# Patient Record
Sex: Male | Born: 1945
Health system: Southern US, Community
[De-identification: ages and names within clinical notes are randomized; demographics above are authoritative.]

## PROBLEM LIST (undated history)

## (undated) DIAGNOSIS — C140 Malignant neoplasm of pharynx, unspecified: Secondary | ICD-10-CM

## (undated) DIAGNOSIS — E11319 Type 2 diabetes mellitus with unspecified diabetic retinopathy without macular edema: Secondary | ICD-10-CM

## (undated) DIAGNOSIS — N189 Chronic kidney disease, unspecified: Secondary | ICD-10-CM

## (undated) DIAGNOSIS — I10 Essential (primary) hypertension: Secondary | ICD-10-CM

## (undated) DIAGNOSIS — W19XXXA Unspecified fall, initial encounter: Secondary | ICD-10-CM

## (undated) DIAGNOSIS — M1990 Unspecified osteoarthritis, unspecified site: Secondary | ICD-10-CM

## (undated) DIAGNOSIS — I209 Angina pectoris, unspecified: Secondary | ICD-10-CM

## (undated) DIAGNOSIS — R51 Headache: Secondary | ICD-10-CM

## (undated) DIAGNOSIS — J189 Pneumonia, unspecified organism: Secondary | ICD-10-CM

## (undated) DIAGNOSIS — K219 Gastro-esophageal reflux disease without esophagitis: Secondary | ICD-10-CM

## (undated) DIAGNOSIS — R269 Unspecified abnormalities of gait and mobility: Secondary | ICD-10-CM

## (undated) DIAGNOSIS — I6529 Occlusion and stenosis of unspecified carotid artery: Secondary | ICD-10-CM

## (undated) DIAGNOSIS — I639 Cerebral infarction, unspecified: Secondary | ICD-10-CM

## (undated) DIAGNOSIS — E785 Hyperlipidemia, unspecified: Secondary | ICD-10-CM

## (undated) DIAGNOSIS — E1142 Type 2 diabetes mellitus with diabetic polyneuropathy: Secondary | ICD-10-CM

## (undated) DIAGNOSIS — Z87898 Personal history of other specified conditions: Secondary | ICD-10-CM

## (undated) DIAGNOSIS — R0602 Shortness of breath: Secondary | ICD-10-CM

## (undated) DIAGNOSIS — I219 Acute myocardial infarction, unspecified: Secondary | ICD-10-CM

## (undated) DIAGNOSIS — C801 Malignant (primary) neoplasm, unspecified: Secondary | ICD-10-CM

## (undated) DIAGNOSIS — R55 Syncope and collapse: Secondary | ICD-10-CM

## (undated) DIAGNOSIS — E119 Type 2 diabetes mellitus without complications: Secondary | ICD-10-CM

## (undated) DIAGNOSIS — I679 Cerebrovascular disease, unspecified: Secondary | ICD-10-CM

## (undated) DIAGNOSIS — I499 Cardiac arrhythmia, unspecified: Secondary | ICD-10-CM

## (undated) DIAGNOSIS — H9190 Unspecified hearing loss, unspecified ear: Secondary | ICD-10-CM

## (undated) DIAGNOSIS — R079 Chest pain, unspecified: Secondary | ICD-10-CM

## (undated) DIAGNOSIS — E039 Hypothyroidism, unspecified: Secondary | ICD-10-CM

## (undated) HISTORY — DX: Unspecified hearing loss, unspecified ear: H91.90

## (undated) HISTORY — DX: Personal history of other specified conditions: Z87.898

## (undated) HISTORY — PX: NASAL SINUS SURGERY: SHX719

## (undated) HISTORY — DX: Cerebral infarction, unspecified: I63.9

## (undated) HISTORY — DX: Type 2 diabetes mellitus without complications: E11.9

## (undated) HISTORY — DX: Malignant neoplasm of pharynx, unspecified: C14.0

## (undated) HISTORY — DX: Occlusion and stenosis of unspecified carotid artery: I65.29

## (undated) HISTORY — DX: Cerebrovascular disease, unspecified: I67.9

## (undated) HISTORY — DX: Chest pain, unspecified: R07.9

## (undated) HISTORY — PX: OTHER SURGICAL HISTORY: SHX169

## (undated) HISTORY — DX: Type 2 diabetes mellitus with unspecified diabetic retinopathy without macular edema: E11.319

## (undated) HISTORY — DX: Shortness of breath: R06.02

## (undated) HISTORY — DX: Unspecified abnormalities of gait and mobility: R26.9

## (undated) HISTORY — DX: Syncope and collapse: R55

## (undated) HISTORY — DX: Unspecified fall, initial encounter: W19.XXXA

## (undated) HISTORY — DX: Type 2 diabetes mellitus with diabetic polyneuropathy: E11.42

---

## 2003-01-17 HISTORY — PX: PORT-A-CATH REMOVAL: SHX5289

## 2003-03-04 ENCOUNTER — Encounter: Admission: RE | Admit: 2003-03-04 | Discharge: 2003-03-04 | Payer: Self-pay | Admitting: Endocrinology

## 2003-03-31 ENCOUNTER — Ambulatory Visit: Admission: RE | Admit: 2003-03-31 | Discharge: 2003-06-29 | Payer: Self-pay | Admitting: *Deleted

## 2003-04-03 ENCOUNTER — Encounter: Admission: RE | Admit: 2003-04-03 | Discharge: 2003-04-03 | Payer: Self-pay | Admitting: Dentistry

## 2003-04-09 ENCOUNTER — Ambulatory Visit (HOSPITAL_COMMUNITY): Admission: RE | Admit: 2003-04-09 | Discharge: 2003-04-09 | Payer: Self-pay | Admitting: Dentistry

## 2003-04-14 ENCOUNTER — Ambulatory Visit (HOSPITAL_COMMUNITY): Admission: RE | Admit: 2003-04-14 | Discharge: 2003-04-14 | Payer: Self-pay | Admitting: Oncology

## 2003-04-22 ENCOUNTER — Ambulatory Visit (HOSPITAL_COMMUNITY): Admission: RE | Admit: 2003-04-22 | Discharge: 2003-04-22 | Payer: Self-pay | Admitting: *Deleted

## 2003-06-22 ENCOUNTER — Ambulatory Visit (HOSPITAL_COMMUNITY): Admission: RE | Admit: 2003-06-22 | Discharge: 2003-06-22 | Payer: Self-pay | Admitting: *Deleted

## 2003-07-15 ENCOUNTER — Ambulatory Visit: Admission: RE | Admit: 2003-07-15 | Discharge: 2003-07-16 | Payer: Self-pay | Admitting: *Deleted

## 2003-07-21 ENCOUNTER — Ambulatory Visit (HOSPITAL_COMMUNITY): Admission: RE | Admit: 2003-07-21 | Discharge: 2003-07-21 | Payer: Self-pay | Admitting: Oncology

## 2003-08-10 ENCOUNTER — Ambulatory Visit (HOSPITAL_COMMUNITY): Admission: RE | Admit: 2003-08-10 | Discharge: 2003-08-10 | Payer: Self-pay | Admitting: *Deleted

## 2003-08-12 ENCOUNTER — Ambulatory Visit: Admission: RE | Admit: 2003-08-12 | Discharge: 2003-08-12 | Payer: Self-pay | Admitting: *Deleted

## 2003-08-14 ENCOUNTER — Ambulatory Visit (HOSPITAL_COMMUNITY): Admission: RE | Admit: 2003-08-14 | Discharge: 2003-08-14 | Payer: Self-pay | Admitting: *Deleted

## 2003-08-19 ENCOUNTER — Ambulatory Visit: Admission: RE | Admit: 2003-08-19 | Discharge: 2003-08-19 | Payer: Self-pay | Admitting: *Deleted

## 2003-09-22 ENCOUNTER — Ambulatory Visit: Payer: Self-pay | Admitting: Dentistry

## 2003-09-25 ENCOUNTER — Encounter: Admission: RE | Admit: 2003-09-25 | Discharge: 2003-09-25 | Payer: Self-pay | Admitting: Otolaryngology

## 2003-10-01 ENCOUNTER — Ambulatory Visit: Payer: Self-pay | Admitting: Dentistry

## 2003-10-28 ENCOUNTER — Ambulatory Visit: Payer: Self-pay | Admitting: Dentistry

## 2003-10-29 ENCOUNTER — Ambulatory Visit (HOSPITAL_COMMUNITY): Admission: RE | Admit: 2003-10-29 | Discharge: 2003-10-29 | Payer: Self-pay | Admitting: Oncology

## 2003-11-10 ENCOUNTER — Ambulatory Visit (HOSPITAL_COMMUNITY): Admission: RE | Admit: 2003-11-10 | Discharge: 2003-11-10 | Payer: Self-pay | Admitting: Oncology

## 2003-11-27 ENCOUNTER — Ambulatory Visit: Payer: Self-pay | Admitting: Oncology

## 2003-12-02 ENCOUNTER — Ambulatory Visit: Payer: Self-pay | Admitting: Dentistry

## 2003-12-18 ENCOUNTER — Ambulatory Visit: Admission: RE | Admit: 2003-12-18 | Discharge: 2003-12-18 | Payer: Self-pay | Admitting: *Deleted

## 2003-12-28 ENCOUNTER — Ambulatory Visit (HOSPITAL_COMMUNITY): Admission: RE | Admit: 2003-12-28 | Discharge: 2003-12-28 | Payer: Self-pay | Admitting: Oncology

## 2003-12-31 ENCOUNTER — Ambulatory Visit (HOSPITAL_COMMUNITY): Admission: RE | Admit: 2003-12-31 | Discharge: 2003-12-31 | Payer: Self-pay | Admitting: Oncology

## 2004-01-19 ENCOUNTER — Ambulatory Visit (HOSPITAL_COMMUNITY): Admission: RE | Admit: 2004-01-19 | Discharge: 2004-01-19 | Payer: Self-pay | Admitting: Oncology

## 2004-03-01 ENCOUNTER — Ambulatory Visit: Payer: Self-pay | Admitting: Dentistry

## 2004-03-31 ENCOUNTER — Ambulatory Visit: Payer: Self-pay | Admitting: Oncology

## 2004-04-18 ENCOUNTER — Ambulatory Visit (HOSPITAL_COMMUNITY): Admission: RE | Admit: 2004-04-18 | Discharge: 2004-04-18 | Payer: Self-pay | Admitting: Oncology

## 2004-04-19 ENCOUNTER — Ambulatory Visit: Admission: RE | Admit: 2004-04-19 | Discharge: 2004-04-19 | Payer: Self-pay | Admitting: *Deleted

## 2004-04-25 ENCOUNTER — Ambulatory Visit: Admission: RE | Admit: 2004-04-25 | Discharge: 2004-04-27 | Payer: Self-pay | Admitting: *Deleted

## 2004-08-08 ENCOUNTER — Ambulatory Visit (HOSPITAL_COMMUNITY): Admission: RE | Admit: 2004-08-08 | Discharge: 2004-08-08 | Payer: Self-pay | Admitting: Oncology

## 2004-08-18 ENCOUNTER — Ambulatory Visit: Payer: Self-pay | Admitting: Oncology

## 2004-09-13 ENCOUNTER — Ambulatory Visit: Payer: Self-pay | Admitting: Dentistry

## 2004-12-16 ENCOUNTER — Ambulatory Visit: Payer: Self-pay | Admitting: Oncology

## 2004-12-21 ENCOUNTER — Ambulatory Visit (HOSPITAL_COMMUNITY): Admission: RE | Admit: 2004-12-21 | Discharge: 2004-12-21 | Payer: Self-pay | Admitting: Oncology

## 2005-02-28 ENCOUNTER — Ambulatory Visit: Payer: Self-pay | Admitting: Oncology

## 2005-10-17 ENCOUNTER — Ambulatory Visit: Payer: Self-pay | Admitting: Oncology

## 2005-10-23 ENCOUNTER — Ambulatory Visit (HOSPITAL_COMMUNITY): Admission: RE | Admit: 2005-10-23 | Discharge: 2005-10-23 | Payer: Self-pay | Admitting: Oncology

## 2005-10-27 ENCOUNTER — Ambulatory Visit (HOSPITAL_COMMUNITY): Admission: RE | Admit: 2005-10-27 | Discharge: 2005-10-27 | Payer: Self-pay | Admitting: Oncology

## 2006-04-18 ENCOUNTER — Ambulatory Visit: Payer: Self-pay | Admitting: Oncology

## 2006-04-23 LAB — CBC WITH DIFFERENTIAL/PLATELET
EOS%: 1.3 % (ref 0.0–7.0)
Eosinophils Absolute: 0.1 10*3/uL (ref 0.0–0.5)
HCT: 32.8 % — ABNORMAL LOW (ref 38.7–49.9)
HGB: 11.7 g/dL — ABNORMAL LOW (ref 13.0–17.1)
Platelets: 155 10*3/uL (ref 145–400)
RBC: 3.79 10*6/uL — ABNORMAL LOW (ref 4.20–5.71)
WBC: 4.5 10*3/uL (ref 4.0–10.0)

## 2006-04-23 LAB — LACTATE DEHYDROGENASE: LDH: 120 U/L (ref 94–250)

## 2006-04-23 LAB — COMPREHENSIVE METABOLIC PANEL
ALT: 9 U/L (ref 0–53)
AST: 10 U/L (ref 0–37)
BUN: 30 mg/dL — ABNORMAL HIGH (ref 6–23)
Creatinine, Ser: 1.7 mg/dL — ABNORMAL HIGH (ref 0.40–1.50)
Sodium: 137 mEq/L (ref 135–145)
Total Bilirubin: 0.4 mg/dL (ref 0.3–1.2)
Total Protein: 6.7 g/dL (ref 6.0–8.3)

## 2006-04-25 ENCOUNTER — Ambulatory Visit (HOSPITAL_COMMUNITY): Admission: RE | Admit: 2006-04-25 | Discharge: 2006-04-25 | Payer: Self-pay | Admitting: Oncology

## 2006-09-10 ENCOUNTER — Encounter: Admission: RE | Admit: 2006-09-10 | Discharge: 2006-09-10 | Payer: Self-pay | Admitting: Endocrinology

## 2006-10-24 ENCOUNTER — Ambulatory Visit: Payer: Self-pay | Admitting: Oncology

## 2006-10-26 ENCOUNTER — Ambulatory Visit (HOSPITAL_COMMUNITY): Admission: RE | Admit: 2006-10-26 | Discharge: 2006-10-26 | Payer: Self-pay | Admitting: Neurosurgery

## 2006-12-28 ENCOUNTER — Ambulatory Visit (HOSPITAL_COMMUNITY): Admission: RE | Admit: 2006-12-28 | Discharge: 2006-12-28 | Payer: Self-pay | Admitting: *Deleted

## 2006-12-28 ENCOUNTER — Encounter (INDEPENDENT_AMBULATORY_CARE_PROVIDER_SITE_OTHER): Payer: Self-pay | Admitting: *Deleted

## 2007-01-25 ENCOUNTER — Ambulatory Visit (HOSPITAL_COMMUNITY): Admission: RE | Admit: 2007-01-25 | Discharge: 2007-01-25 | Payer: Self-pay | Admitting: *Deleted

## 2009-09-08 ENCOUNTER — Other Ambulatory Visit: Admission: RE | Admit: 2009-09-08 | Discharge: 2009-09-08 | Payer: Self-pay | Admitting: Oncology

## 2010-02-06 ENCOUNTER — Encounter: Payer: Self-pay | Admitting: Oncology

## 2010-05-31 NOTE — Op Note (Signed)
NAME:  Todd Hall, Todd Hall NO.:  [id number]   MEDICAL RECORD NO.:  [id number]          PATIENT TYPE:  AMB   LOCATION:  ENDO                         FACILITY:  [facility]   PHYSICIAN:  Georgiana Spinner, M.D.    DATE OF BIRTH:  December 08, 1945   DATE OF PROCEDURE:  DATE OF DISCHARGE:                               OPERATIVE REPORT   PROCEDURE:  Upper endoscopy with biopsy.   INDICATIONS FOR PROCEDURE:  Vomiting.   ANESTHESIA:  Fentanyl 50 mcg, Versed 5 mg.   PROCEDURE:  With the patient mildly sedated in the left lateral  decubitus position, the Pentax videoscopic endoscope was inserted in the  mouth and passed under direct vision through the esophagus which  appeared normal.  There was no evidence of esophagitis or Barrett's  esophagus.  We entered into the stomach.  The fundus and body appeared  normal.  The antrum in the prepyloric area appeared somewhat edematous,  and I elected to photograph and biopsy this.  The duodenal bulb and  second portion of the duodenum appeared normal.  From this point the  endoscope was slowly withdrawn, taking circumferential views of the  duodenal mucosa until the endoscope had been pulled back into stomach,  placed in retroflexion to view the stomach from below.  The endoscope  was straightened and withdrawn, taking circumferential views of the  remaining gastric and esophageal mucosa.  The patient's vital signs and  pulse oximeter remained stable.  The patient tolerated the procedure  well without apparent complications.   FINDINGS:  Mild, possibly edematous changes of the prepyloric area,  biopsied.  Otherwise an unremarkable examination.   PLAN:  We will schedule the patient for a gastric emptying study and  have the patient follow up with me as an outpatient.           ______________________________  Georgiana Spinner, M.D.     GMO/MEDQ  D:  12/28/2006  T:  12/28/2006  Job:  [id number]

## 2010-06-03 NOTE — Op Note (Signed)
NAME:  Todd Hall, Todd Hall                          ACCOUNT NO.:  [id number]   MEDICAL RECORD NO.:  [id number]                   PATIENT TYPE:  AMB   LOCATION:  DAY                                  FACILITY:  [facility]   PHYSICIAN:  Charlynne Pander, D.D.S.          DATE OF BIRTH:  03/25/1945   DATE OF PROCEDURE:  04/09/2003  DATE OF DISCHARGE:                                 OPERATIVE REPORT   PREOPERATIVE DIAGNOSES:  1. Squamous cell carcinoma of the left base of tongue.  2. Preradiation therapy dental protocol.  3. Chronic periodontitis.  4. Upper right and upper left quadrant buccal exostoses.  5. Malocclusion of the maxillary tuberosities.   POSTOPERATIVE DIAGNOSES:  1. Squamous cell carcinoma of the left base of tongue.  2. Preradiation therapy dental protocol.  3. Chronic periodontitis.  4. Upper right and upper left quadrant buccal exostoses.  5. Malocclusion of the maxillary tuberosities.   OPERATION/PROCEDURE:  1. Dental examination.  2. Extraction of remaining teeth (tooth #6, 7, 8, 9, 10, 11, 12, 20, 21, 22,     23, 24, 25, 26, 27, 28, and 29).  3. Four quadrants of alveoloplasty.  4. Upper right and upper left quadrant buccal exostosis reduction.  5. Upper right and upper left soft tissue tuberosity reduction.   SURGEON:  Charlynne Pander, D.D.S.   ASSISTANT:  Elliot Dally (Sales executive).   ANESTHESIA:  1. General anesthesia with a nasal endotracheal tube.  2. Local anesthesia with a total utilization of five carpules, each     containing 36 mg of Xylocaine with 0.018 mg of epinephrine as well as two     carpules each containing 9 mg of Marcaine with 0.009 mg of epinephrine.   MEDICATIONS:  Ampicillin 2 g IV prior to invasive dental procedures.   SPECIMENS:  There were 17 teeth which were discarded.   DRAINS/CULTURES:  None.   COMPLICATIONS:  None.   FLUIDS:  1700 mL of lactated Ringer's solution.   ESTIMATED BLOOD LOSS:  100 mL.   INDICATIONS:  The  patient had a recent diagnosis of squamous cell carcinoma  of the left base of tongue.  The patient with anticipated radiation therapy.  The patient was examined and had a treatment plan for a preradiation therapy  dental protocol.  Treatment plan for removal of all remaining teeth with  alveoloplasty to achieve primary closure along with the upper right and  upper left exostosis reductions on the buccal aspect as well as fibrous  tuberosity reductions as indicated.  His treatment plan was formulated to  decrease the risk and complications associated with anticipated radiation  therapy to effect the patient's systemic health while undergoing radiation  therapy and chemotherapy.  This also will assist in the future  prosthodontics rehabilitation of the patient.   OPERATIVE FINDINGS:  The patient was examined in the operating room #6.  The  teeth were identified for extraction.  The upper right and upper left buccal  exostoses were identified along with the maxillary tuberosities which were  hanging down into the ideal occlusal plane and needed to be reduced.  The  patient was noted to be affected by chronic periodontitis, significant tooth  mobility, chronic apical periodontitis, and the presence of the buccal  exostoses as above.   DESCRIPTION OF PROCEDURE:  The patient was brought to the main operating  room #6.  The patient was placed in the supine position on the operating  room table.  General anesthesia was induced per the anesthesia team with a  nasal endotracheal tube.  The patient was then prepped and draped in the  usual manner for an oral surgery procedure.  The throat pack was placed at  this time.  The oral cavity was thoroughly examined with the findings as  noted above.  The patient was then ready for the oral surgical procedure as  follows:   Local anesthesia was administered sequentially over the two-hour long  procedure with a total utilization of five carpules each  containing 36 mg of  Xylocaine with 0.018 mg of epinephrine as well as two carpules each  containing 9 mg Marcaine with 0.009 mg of epinephrine.   The maxillary quadrants were first approached.  Anesthesia was delivered as  previously described.  The maxillary right quadrant was then approached.  A  15-blade incision was made from the distal of the tuberosity through the  distal of #14.  A surgical flap was then reflected.  The remaining teeth  were then subluxated and removed with a 150 forceps without complications.  This included the extraction of tooth #6, 7, 8, 9, 10, 11, and 12.  Again  these were achieved without complications.  A 15-blade was then made from  the distal of the maxillary left tuberosity and extended through the  previous incision.  The surgical flap was then further reflected.  The upper  right quadrant and upper left quadrant buccal exostoses were then visualized  and removed with a rongeurs and bone file appropriately.  The fibrous  tuberosity reduction was then achieved utilizing a series of 15-blade  incisions to remove the excess tissue.  The surgical site was then irrigated  with copious amounts of sterile saline.  The tissues were approximated and  again trimmed appropriately.  The surgical site was then closed utilizing 3-  0 chromic gut suture in a continuous interrupted suture technique from the  maxillary right tuberosity through the mesial of #8.  The maxillary left  quadrant was then closed utilizing 3-0 chromic gut suture and a continuous  interrupted suture technique from the distal of the tuberosity through the  mesial of #9.  The entire mouth was then again irrigated with copious  amounts of sterile saline at this time.   The mandibular quadrants were then approached.  Anesthesia was delivered as  previously described.  A 15-blade incision was made from the distal of #19 through the distal of #30.  Surgical flap was then reflected.  Tooth #20,  21,  22, 23, 24, 25, 26, 27, 28, and 29 were then removed with 151 forceps  without complications.  Significant alveoloplasty was then performed  utilizing a rongeurs and bone file.  The tissues were approximated and  trimmed approximately.  The patient had significant gingival defects  associated with the previous periodontal problems.  The surgical site was  then irrigated with copious amounts of sterile saline.  The tissues were  approximated and trimmed appropriately.  The surgical site was then closed  from the distal of #19 through the mesial of #24 utilizing 3-0 chromic gut  suture in a continuous interrupted suture technique x1.  The mandibular  right quadrant was then approached and closed utilizing 3-0 chromic gut  suture from the distal of #30 through the mesial of #25 with a continuous  interrupted suture technique x1.  At this point in time the entire mouth was  irrigated with copious amounts of sterile saline.  The patient was examined  for complications.  Seeing none, the oral surgical procedure was deemed to  be complete.  The throat pack was removed at this time.  The series of 4 x 4  gauzes were placed in the mouth at the request of the anesthesia team along  with an oral airway.  The patient was then handed over to the anesthesia  team for final disposition.  After an appropriate amount of time, the  patient was extubated and taken to the post anesthesia care unit with stable  vital signs and good oxygenation level.   The patient will be given one week's worth of amoxicillin 500 mg taking one  capsule every eight hours for seven days.  The patient also will be given  appropriate pain medication.  The patient is to return to the dental clinic  for evaluation for suture removal in approximately one week.  The patient is  to call if he has any complications.                                               Charlynne Pander, D.D.S.    RFK/MEDQ  D:  04/09/2003  T:  04/09/2003   Job:  [id number]   cc:   Elmer Sow. Dorna Bloom, M.D.  [address] - [facility]  [place]  [place] [address]  Fax: [phone]   Pierce Crane, M.D.  [address] - [facility]  [place]  [place] [address]  Fax: [phone]

## 2010-10-27 LAB — BASIC METABOLIC PANEL
Calcium: 9.6
Chloride: 108
GFR calc Af Amer: 46 — ABNORMAL LOW
GFR calc non Af Amer: 38 — ABNORMAL LOW
Sodium: 137

## 2010-10-27 LAB — CBC
HCT: 33.8 — ABNORMAL LOW
Hemoglobin: 11.5 — ABNORMAL LOW
MCV: 90.9
Platelets: 150
RBC: 3.72 — ABNORMAL LOW

## 2010-10-27 LAB — POTASSIUM: Potassium: 6.2 — ABNORMAL HIGH

## 2010-12-21 ENCOUNTER — Encounter (HOSPITAL_COMMUNITY): Payer: Self-pay

## 2010-12-21 ENCOUNTER — Encounter (HOSPITAL_COMMUNITY): Payer: Self-pay | Admitting: Pharmacy Technician

## 2010-12-21 ENCOUNTER — Encounter (HOSPITAL_COMMUNITY)
Admission: RE | Admit: 2010-12-21 | Discharge: 2010-12-21 | Disposition: A | Discharge status: Home / Self Care | Payer: Medicare Other | Source: Ambulatory Visit | Attending: Neurosurgery | Admitting: Neurosurgery

## 2010-12-21 ENCOUNTER — Other Ambulatory Visit: Payer: Self-pay

## 2010-12-21 HISTORY — DX: Acute myocardial infarction, unspecified: I21.9

## 2010-12-21 HISTORY — DX: Angina pectoris, unspecified: I20.9

## 2010-12-21 HISTORY — DX: Cardiac arrhythmia, unspecified: I49.9

## 2010-12-21 HISTORY — DX: Headache: R51

## 2010-12-21 HISTORY — DX: Pneumonia, unspecified organism: J18.9

## 2010-12-21 HISTORY — DX: Essential (primary) hypertension: I10

## 2010-12-21 HISTORY — DX: Gastro-esophageal reflux disease without esophagitis: K21.9

## 2010-12-21 HISTORY — DX: Hypothyroidism, unspecified: E03.9

## 2010-12-21 HISTORY — DX: Chronic kidney disease, unspecified: N18.9

## 2010-12-21 HISTORY — DX: Malignant (primary) neoplasm, unspecified: C80.1

## 2010-12-21 HISTORY — DX: Unspecified osteoarthritis, unspecified site: M19.90

## 2010-12-21 HISTORY — DX: Hyperlipidemia, unspecified: E78.5

## 2010-12-21 LAB — CBC
MCH: 30.4 pg (ref 26.0–34.0)
MCHC: 33.1 g/dL (ref 30.0–36.0)
MCV: 91.7 fL (ref 78.0–100.0)
Platelets: 156 10*3/uL (ref 150–400)
RBC: 3.62 MIL/uL — ABNORMAL LOW (ref 4.22–5.81)
RDW: 13.5 % (ref 11.5–15.5)

## 2010-12-21 LAB — SURGICAL PCR SCREEN
MRSA, PCR: NEGATIVE
Staphylococcus aureus: NEGATIVE

## 2010-12-21 LAB — BASIC METABOLIC PANEL
Calcium: 9.3 mg/dL (ref 8.4–10.5)
Creatinine, Ser: 1.57 mg/dL — ABNORMAL HIGH (ref 0.50–1.35)
GFR calc Af Amer: 52 mL/min — ABNORMAL LOW (ref >90)
GFR calc non Af Amer: 45 mL/min — ABNORMAL LOW (ref >90)
Sodium: 138 mEq/L (ref 135–145)

## 2010-12-21 NOTE — Progress Notes (Signed)
CXR requested from Dr. Marylen Ponto office via vm message.

## 2010-12-21 NOTE — Pre-Procedure Instructions (Signed)
20 GURVEER COLUCCI  12/21/2010   Your procedure is scheduled on:  12-27-2010 Tuesday  Report to [facility] Stay [facility] at 9:30 AM.  Call this number if you have problems the morning of surgery: [phone]   Remember:   Do not eat food:After Midnight.  May have clear liquids: up to 4 Hours before arrival.  Clear liquids include soda, tea, black coffee, apple or grape juice, broth.  Take these medicines the morning of surgery with A SIP OF WATER: coreg, vicodin if needed, omeprazole   Do not wear jewelry, make-up or nail polish.  Do not wear lotions, powders, or perfumes. You may wear deodorant.  Do not shave 48 hours prior to surgery.  Do not bring valuables to the hospital.  Contacts, dentures or bridgework may not be worn into surgery.  Leave suitcase in the car. After surgery it may be brought to your room.  For patients admitted to the hospital, checkout time is 11:00 AM the day of discharge.   Patients discharged the day of surgery will not be allowed to drive home.  Name and phone number of your driver: siris hoos - wife [phone]  Special Instructions: CHG Shower Use Special Wash: 1/2 bottle night before surgery and 1/2 bottle morning of surgery.   Please read over the following fact sheets that you were given: Pain Booklet, Coughing and Deep Breathing, MRSA Information and Surgical Site Infection Prevention

## 2010-12-22 NOTE — Progress Notes (Signed)
LVM for Misty Stanley in Medical Records at Dr. Marylen Ponto office requesting CXR.

## 2010-12-24 NOTE — H&P (Signed)
Todd Hall  #[id number]  DOB:  Mar 21, 1945    Todd Hall returns to review his MRI of his cervical spine which re-demonstrates a broad-based disc protrusion at C6-7 asymmetric into the left lateral recess and left neural foramen which appears to be compressing the left C7 nerve root.  He additionally has some degenerative changes at other levels in his cervical spine, including at C3-4 he has a broad-based disc bulge with prominent spurring to the right and left midline narrowing both lateral recesses and both neural foramina with severe facet arthritis at this level.  Other abnormalities include at C4-5 he has a tiny disc protrusion just to the left of midline without significant impingement.  At C5-6 he has a small central disc protrusion without significant impingement.  Additionally, he has C7-T1 through T5 no evidence of any significant disc protrusions.    I re-examined Todd Hall today and on examination today he has significant triceps weakness on the left, as well as some persistent hand intrinsic and finger extensor weakness.    Based on Todd Hall' MRI findings as well as his EMG and nerve conduction velocities and his pain complaints which are principally of left upper extremity pain and weakness rather than right upper extremity symptoms, I believe that his biggest problem remains the C6-7 disc herniation and left C7 radiculopathy.  I have recommended he go a head with anterior cervical decompression and fusion at the C6-7 level which is the same surgery that I recommended in 2008 which was cancelled because of hyperkalemia prior to going ahead with surgery.  He says he is essentially no better, continues to have significant pain and weakness and, at this point, I recommend proceeding with surgery.  This will be planned for early December.  Risks and benefits were discussed with the patient in great detail and we also went over patient education materials and gave him literature and a booklet on  cervical spine surgery and showed him models and answered his questions.  We plan on fitting him for a cervical collar and plan on proceeding with surgery in the near future.          Todd Hall, M.D./sv  cc: Dr. Adela Lank   NEUROSURGICAL CONSULTATION AND H & P   Todd Hall  DOB:  April 15, 1945 #[id number]    November 02, 2010   HISTORY:     Todd Hall is a 65 year old retired man who presents at the request of Todd Hall with bilateral hand and feet numbness. He says he is dropping things with his hands. He says his left side is weaker than his right. He says this has increased recently, but has been going on for the last year.  He had an EMG and nerve conduction velocities performed by Dr. Wynn Hall and this demonstrates evidence of right ulnar neuropathy at the elbow, with slowing of the ulnar and median sensory conductions, with some asymmetry of the right side with absent right median sensory conduction. He also has lower extremity numbness. This was felt to be potentially be a part of a more widespread sensory polyneuropathy associated with his diabetes and prior history of chemotherapy.  Lower extremity studies would need to be performed to confirm that.  He cannot rule out carpal tunnel syndrome on the right. Additionally, he has chronic left C7 and C8 radiculopathies.   I had previously seen Todd Hall for neck problems and actually had scheduled him for surgery, but is potassium was elevated and this  was therefore canceled. This was in October of 2008 and was planned as a C6-7 anterior cervical decompression and fusion.    REVIEW OF SYSTEMS:   A detailed Review of Systems sheet was reviewed with the patient.  Pertinent positives include hearing loss, nasal congestion/drainage, high blood pressure, high cholesterol, arm weakness, leg weakness, neck pain, diabetes, and thyroid disease.   All other systems are negative; this includes Constitutional symptoms, Eyes, Respiratory,  Gastrointestinal, Genitourinary, Integumentary & Breast, Neurologic, Psychiatric, Hematologic/Lymphatic, Allergic/Immunologic.    PAST MEDICAL HISTORY:      Current Medical Conditions:    Throat cancer with previous radiation.  He has high blood pressure, heart attack and diabetes.      Medications and Allergies:  He has no known drug allergies. Medications - Humalog 50/20, Amitriptyline 50 mg. q.h.s., Hectorol 0.5 mg. qd, Synthroid 75 mcg. qd, Coreg 20 mg. q.h.s., Omeprazole 40 g. qd, Crestor, Sodium Bicar 650 b.i.d. and Metanx b.i.d.      Height and Weight:     He is 5'7" tall, 155 lbs.    SOCIAL HISTORY:    He is a nonsmoker, nondrinker, and no history of substance abuse.    PHYSICAL EXAMINATION:      General Appearance:   On examination today, Todd Hall is a pleasant and cooperative man in no acute distress.      Blood Pressure, Pulse:     His blood pressure is 140/78.  Heart rate is 70 and regular.  Respiratory rate is 16.      HEENT - normocephalic, atraumatic.  The pupils are equal, round and reactive to light.  The extraocular muscles are intact.  Sclerae - white.  Conjunctiva - pink.  Oropharynx benign.  Uvula midline.     Neck - there are no masses, meningismus, deformities, tracheal deviation, jugular vein distention or carotid bruits.  He has a decreased cervical range of motion.  Positive Spurlings' maneuver to the left.  Lhermitte's sign is not present with axial compression.  He has never had prior surgery, but did have radiation to his neck.      Respiratory - there is normal respiratory effort with good intercostal function.  Lungs are clear to auscultation.  There are no rales, rhonchi or wheezes.      Cardiovascular - the heart has regular rate and rhythm to auscultation.  No murmurs are appreciated.  There is no extremity edema, cyanosis or clubbing.  There are palpable pedal pulses.      Abdomen - soft, nontender, no hepatosplenomegaly appreciated or masses.  There are  active bowel sounds.  No guarding or rebound.      Musculoskeletal Examination - he has a positive Tinel's sign at the right elbow greater than left elbow, positive Tinel's sign at the left wrist, positive Phalen's signs left greater than right wrist.      NEUROLOGICAL EXAMINATION: The patient is oriented to time, person and place and has good recall of both recent and remote memory with normal attention span and concentration.  The patient speaks with clear and fluent speech and exhibits normal language function and appropriate fund of knowledge.      Cranial Nerve Examination - pupils are equal, round and reactive to light.  Extraocular movements are full.  Visual fields are full to confrontational testing.  Facial sensation and facial movement are symmetric and intact.  Hearing is intact to finger rub.  Palate is upgoing.  Shoulder shrug is symmetric.  Tongue protrudes in the midline.  Motor Examination - motor strength is 5/5 in the bilateral deltoids, biceps, handgrips, wrist extensors, interosseous, 5/5 right triceps, left triceps at 4/5, left hand intrinsics and finger extensors at 4/5.  In the lower extremities motor strength is 5/5 in hip flexion, extension, quadriceps, hamstrings, plantar flexion, dorsiflexion and extensor hallucis longus.      Sensory Examination - he appears to have decreased vibratory sensation in both of his feet, left greater than right.  He has intact proprioception in both of his feet.  He has stocking distribution of numbness with decreased pin sensation to the middle of his ankles.  He appears to have a decreased pin sensation in the left ulnar distribution in both arms to his forearms.       Deep Tendon Reflexes - trace in the biceps, triceps, and brachioradialis, trace in the knees, trace in the ankles.  The great toes are downgoing to plantar stimulation.    IMPRESSION AND RECOMMENDATIONS: Jayko Voorhees is a 65 year old man who appears to have a cervical  radiculopathy, peripheral neuropathy, ulnar neuropathy, and carpal tunnel syndrome.  I suggested we try wrist splints bilaterally to see if this will help with his discomfort. I do think that his principal problem is related to a cervical radiculopathy in that he appears to have weakness in is left arm and hand in a C7 and C8 distribution. I have recommended we get an MRI of his cervical spine and also cervical spine radiographs.  We will see how he does with the wrist splints and I will make further recommendations after those studies have been done.    [facility]    Todd Hall, M.D.  JDS:aft cc: Dr. Bernadene Person

## 2010-12-27 ENCOUNTER — Encounter (HOSPITAL_COMMUNITY): Payer: Self-pay | Admitting: Anesthesiology

## 2010-12-27 ENCOUNTER — Encounter (HOSPITAL_COMMUNITY): Payer: Self-pay | Admitting: *Deleted

## 2010-12-27 ENCOUNTER — Ambulatory Visit (HOSPITAL_COMMUNITY): Payer: Medicare Other | Admitting: *Deleted

## 2010-12-27 ENCOUNTER — Encounter (HOSPITAL_COMMUNITY)
Admission: RE | Disposition: A | Discharge status: Home / Self Care | Payer: Self-pay | Source: Ambulatory Visit | Attending: Neurosurgery

## 2010-12-27 ENCOUNTER — Ambulatory Visit (HOSPITAL_COMMUNITY): Payer: Medicare Other

## 2010-12-27 ENCOUNTER — Inpatient Hospital Stay (HOSPITAL_COMMUNITY)
Admission: RE | Admit: 2010-12-27 | Discharge: 2010-12-28 | DRG: 473 | Disposition: A | Discharge status: Home / Self Care | Payer: Medicare Other | Source: Ambulatory Visit | Attending: Neurosurgery | Admitting: Neurosurgery

## 2010-12-27 DIAGNOSIS — M5412 Radiculopathy, cervical region: Secondary | ICD-10-CM | POA: Diagnosis present

## 2010-12-27 DIAGNOSIS — H9190 Unspecified hearing loss, unspecified ear: Secondary | ICD-10-CM | POA: Diagnosis present

## 2010-12-27 DIAGNOSIS — Z79899 Other long term (current) drug therapy: Secondary | ICD-10-CM

## 2010-12-27 DIAGNOSIS — Z794 Long term (current) use of insulin: Secondary | ICD-10-CM

## 2010-12-27 DIAGNOSIS — E1142 Type 2 diabetes mellitus with diabetic polyneuropathy: Secondary | ICD-10-CM | POA: Diagnosis present

## 2010-12-27 DIAGNOSIS — I10 Essential (primary) hypertension: Secondary | ICD-10-CM | POA: Diagnosis present

## 2010-12-27 DIAGNOSIS — E079 Disorder of thyroid, unspecified: Secondary | ICD-10-CM | POA: Diagnosis present

## 2010-12-27 DIAGNOSIS — E1149 Type 2 diabetes mellitus with other diabetic neurological complication: Secondary | ICD-10-CM | POA: Diagnosis present

## 2010-12-27 DIAGNOSIS — Z01812 Encounter for preprocedural laboratory examination: Secondary | ICD-10-CM

## 2010-12-27 DIAGNOSIS — M5020 Other cervical disc displacement, unspecified cervical region: Principal | ICD-10-CM | POA: Diagnosis present

## 2010-12-27 DIAGNOSIS — E7800 Pure hypercholesterolemia, unspecified: Secondary | ICD-10-CM | POA: Diagnosis present

## 2010-12-27 HISTORY — PX: ANTERIOR CERVICAL DECOMP/DISCECTOMY FUSION: SHX1161

## 2010-12-27 LAB — GLUCOSE, CAPILLARY
Glucose-Capillary: 209 mg/dL — ABNORMAL HIGH (ref 70–99)
Glucose-Capillary: 220 mg/dL — ABNORMAL HIGH (ref 70–99)

## 2010-12-27 SURGERY — ANTERIOR CERVICAL DECOMPRESSION/DISCECTOMY FUSION 1 LEVEL
Anesthesia: General | Site: Neck | Wound class: Clean

## 2010-12-27 MED ORDER — ACETAMINOPHEN 325 MG PO TABS: 650.0000 mg | ORAL_TABLET | ORAL | Status: DC | PRN

## 2010-12-27 MED ORDER — GLYCOPYRROLATE 0.2 MG/ML IJ SOLN
INTRAMUSCULAR | Status: DC | PRN
  Administered 2010-12-27: .6 mg via INTRAVENOUS

## 2010-12-27 MED ORDER — ROSUVASTATIN CALCIUM 10 MG PO TABS
10.0000 mg | ORAL_TABLET | Freq: Every day | ORAL | Status: DC
  Administered 2010-12-27 – 2010-12-28 (×2): 10 mg via ORAL
  Filled 2010-12-27 (×2): qty 1

## 2010-12-27 MED ORDER — DEXAMETHASONE SODIUM PHOSPHATE 10 MG/ML IJ SOLN
INTRAMUSCULAR | Status: DC | PRN
  Administered 2010-12-27: 10 mg via INTRAVENOUS

## 2010-12-27 MED ORDER — LEVOTHYROXINE SODIUM 100 MCG PO TABS
100.0000 ug | ORAL_TABLET | Freq: Every day | ORAL | Status: DC
  Administered 2010-12-28: 100 ug via ORAL
  Filled 2010-12-27 (×2): qty 1

## 2010-12-27 MED ORDER — PANTOPRAZOLE SODIUM 40 MG PO TBEC: 40.0000 mg | DELAYED_RELEASE_TABLET | Freq: Every day | ORAL | Status: DC

## 2010-12-27 MED ORDER — PROPOFOL 10 MG/ML IV EMUL
INTRAVENOUS | Status: DC | PRN
  Administered 2010-12-27: 120 mg via INTRAVENOUS

## 2010-12-27 MED ORDER — MENTHOL 3 MG MT LOZG: 1.0000 | LOZENGE | OROMUCOSAL | Status: DC | PRN

## 2010-12-27 MED ORDER — DIAZEPAM 5 MG PO TABS: 5.0000 mg | ORAL_TABLET | Freq: Four times a day (QID) | ORAL | Status: DC | PRN

## 2010-12-27 MED ORDER — HEMOSTATIC AGENTS (NO CHARGE) OPTIME
TOPICAL | Status: DC | PRN
  Administered 2010-12-27: 1 via TOPICAL

## 2010-12-27 MED ORDER — MORPHINE SULFATE 4 MG/ML IJ SOLN: 1.0000 mg | INTRAMUSCULAR | Status: DC | PRN

## 2010-12-27 MED ORDER — INSULIN LISPRO PROT & LISPRO (50-50 MIX) 100 UNIT/ML ~~LOC~~ SUSP
24.0000 [IU] | Freq: Every day | SUBCUTANEOUS | Status: DC
  Administered 2010-12-28: 24 [IU] via SUBCUTANEOUS

## 2010-12-27 MED ORDER — ZOLPIDEM TARTRATE 5 MG PO TABS: 5.0000 mg | ORAL_TABLET | Freq: Every evening | ORAL | Status: DC | PRN

## 2010-12-27 MED ORDER — OLMESARTAN MEDOXOMIL 40 MG PO TABS
40.0000 mg | ORAL_TABLET | Freq: Every day | ORAL | Status: DC
  Administered 2010-12-27 – 2010-12-28 (×2): 40 mg via ORAL
  Filled 2010-12-27 (×2): qty 1

## 2010-12-27 MED ORDER — SODIUM CHLORIDE 0.9 % IR SOLN
Status: DC | PRN
  Administered 2010-12-27: 1000 mL

## 2010-12-27 MED ORDER — CARVEDILOL PHOSPHATE ER 20 MG PO CP24
20.0000 mg | ORAL_CAPSULE | Freq: Every day | ORAL | Status: DC
  Administered 2010-12-27 – 2010-12-28 (×2): 20 mg via ORAL
  Filled 2010-12-27 (×2): qty 1

## 2010-12-27 MED ORDER — ONDANSETRON HCL 4 MG/2ML IJ SOLN: 4.0000 mg | INTRAMUSCULAR | Status: DC | PRN

## 2010-12-27 MED ORDER — SODIUM BICARBONATE 650 MG PO TABS
650.0000 mg | ORAL_TABLET | Freq: Two times a day (BID) | ORAL | Status: DC
  Administered 2010-12-27 – 2010-12-28 (×2): 650 mg via ORAL
  Filled 2010-12-27 (×3): qty 1

## 2010-12-27 MED ORDER — CEFAZOLIN SODIUM-DEXTROSE 2-3 GM-% IV SOLR
INTRAVENOUS | Status: AC
  Filled 2010-12-27: qty 50

## 2010-12-27 MED ORDER — ONDANSETRON HCL 4 MG/2ML IJ SOLN
INTRAMUSCULAR | Status: DC | PRN
  Administered 2010-12-27 (×2): 4 mg via INTRAVENOUS

## 2010-12-27 MED ORDER — SODIUM CHLORIDE 0.9 % IR SOLN
Status: DC | PRN
  Administered 2010-12-27: 12:00:00

## 2010-12-27 MED ORDER — ROCURONIUM BROMIDE 100 MG/10ML IV SOLN
INTRAVENOUS | Status: DC | PRN
  Administered 2010-12-27: 40 mg via INTRAVENOUS

## 2010-12-27 MED ORDER — SODIUM CHLORIDE 0.9 % IJ SOLN
3.0000 mL | Freq: Two times a day (BID) | INTRAMUSCULAR | Status: DC
  Administered 2010-12-27: 3 mL via INTRAVENOUS

## 2010-12-27 MED ORDER — MEPERIDINE HCL 25 MG/ML IJ SOLN: 6.2500 mg | INTRAMUSCULAR | Status: DC | PRN

## 2010-12-27 MED ORDER — BUPIVACAINE HCL (PF) 0.25 % IJ SOLN: INTRAMUSCULAR | Status: DC | PRN

## 2010-12-27 MED ORDER — SODIUM CHLORIDE 0.9 % IJ SOLN: 3.0000 mL | INTRAMUSCULAR | Status: DC | PRN

## 2010-12-27 MED ORDER — CEFAZOLIN SODIUM 1-5 GM-% IV SOLN
1.0000 g | Freq: Three times a day (TID) | INTRAVENOUS | Status: AC
  Administered 2010-12-27 – 2010-12-28 (×2): 1 g via INTRAVENOUS
  Filled 2010-12-27 (×2): qty 50

## 2010-12-27 MED ORDER — PHENOL 1.4 % MT LIQD: 1.0000 | OROMUCOSAL | Status: DC | PRN

## 2010-12-27 MED ORDER — INSULIN LISPRO PROT & LISPRO (50-50 MIX) 100 UNIT/ML ~~LOC~~ SUSP
22.0000 [IU] | Freq: Every day | SUBCUTANEOUS | Status: DC
  Administered 2010-12-27: 22 [IU] via SUBCUTANEOUS

## 2010-12-27 MED ORDER — HYDROCODONE-ACETAMINOPHEN 5-325 MG PO TABS: 1.0000 | ORAL_TABLET | ORAL | Status: DC | PRN

## 2010-12-27 MED ORDER — LIDOCAINE-EPINEPHRINE 1 %-1:100000 IJ SOLN
INTRAMUSCULAR | Status: DC | PRN
  Administered 2010-12-27: 20 mL

## 2010-12-27 MED ORDER — THROMBIN 5000 UNITS EX KIT
PACK | CUTANEOUS | Status: DC | PRN
  Administered 2010-12-27 (×2): 5000 [IU] via TOPICAL

## 2010-12-27 MED ORDER — KCL IN DEXTROSE-NACL 20-5-0.45 MEQ/L-%-% IV SOLN
INTRAVENOUS | Status: DC
  Administered 2010-12-27: 19:00:00 via INTRAVENOUS
  Filled 2010-12-27 (×3): qty 1000

## 2010-12-27 MED ORDER — METHOCARBAMOL 500 MG PO TABS
500.0000 mg | ORAL_TABLET | Freq: Four times a day (QID) | ORAL | Status: DC | PRN
  Administered 2010-12-27: 500 mg via ORAL
  Filled 2010-12-27: qty 1

## 2010-12-27 MED ORDER — SODIUM CHLORIDE 0.9 % IV SOLN
INTRAVENOUS | Status: AC
  Filled 2010-12-27: qty 500

## 2010-12-27 MED ORDER — EPHEDRINE SULFATE 50 MG/ML IJ SOLN
INTRAMUSCULAR | Status: DC | PRN
  Administered 2010-12-27: 10 mg via INTRAVENOUS
  Administered 2010-12-27: 5 mg via INTRAVENOUS

## 2010-12-27 MED ORDER — HYDROMORPHONE HCL PF 1 MG/ML IJ SOLN
0.2500 mg | INTRAMUSCULAR | Status: DC | PRN
  Administered 2010-12-27 (×2): 0.5 mg via INTRAVENOUS

## 2010-12-27 MED ORDER — HYDROCODONE-ACETAMINOPHEN 5-325 MG PO TABS
1.0000 | ORAL_TABLET | ORAL | Status: DC | PRN
  Administered 2010-12-27: 1 via ORAL
  Filled 2010-12-27: qty 1

## 2010-12-27 MED ORDER — BUPIVACAINE HCL (PF) 0.5 % IJ SOLN
INTRAMUSCULAR | Status: DC | PRN
  Administered 2010-12-27: 30 mL

## 2010-12-27 MED ORDER — CYCLOBENZAPRINE HCL 10 MG PO TABS: 10.0000 mg | ORAL_TABLET | Freq: Three times a day (TID) | ORAL | Status: DC | PRN

## 2010-12-27 MED ORDER — PROMETHAZINE HCL 25 MG/ML IJ SOLN: 6.2500 mg | INTRAMUSCULAR | Status: DC | PRN

## 2010-12-27 MED ORDER — INSULIN LISPRO PROT & LISPRO (50-50 MIX) 100 UNIT/ML ~~LOC~~ SUSP
24.0000 [IU] | Freq: Every day | SUBCUTANEOUS | Status: DC
  Filled 2010-12-27: qty 10

## 2010-12-27 MED ORDER — ACETAMINOPHEN 650 MG RE SUPP: 650.0000 mg | RECTAL | Status: DC | PRN

## 2010-12-27 MED ORDER — SUFENTANIL CITRATE 50 MCG/ML IV SOLN
INTRAVENOUS | Status: DC | PRN
  Administered 2010-12-27: 10 ug via INTRAVENOUS
  Administered 2010-12-27: 15 ug via INTRAVENOUS

## 2010-12-27 MED ORDER — CEFAZOLIN SODIUM-DEXTROSE 2-3 GM-% IV SOLR
2.0000 g | Freq: Once | INTRAVENOUS | Status: AC
  Administered 2010-12-27: 2 g via INTRAVENOUS

## 2010-12-27 MED ORDER — FUROSEMIDE 40 MG PO TABS
40.0000 mg | ORAL_TABLET | Freq: Every day | ORAL | Status: DC
  Administered 2010-12-27 – 2010-12-28 (×2): 40 mg via ORAL
  Filled 2010-12-27 (×2): qty 1

## 2010-12-27 MED ORDER — NEOSTIGMINE METHYLSULFATE 1 MG/ML IJ SOLN
INTRAMUSCULAR | Status: DC | PRN
  Administered 2010-12-27: 4 mg via INTRAVENOUS

## 2010-12-27 MED ORDER — DOXERCALCIFEROL 0.5 MCG PO CAPS
1.0000 ug | ORAL_CAPSULE | Freq: Every day | ORAL | Status: DC
  Administered 2010-12-27 – 2010-12-28 (×2): 1 ug via ORAL
  Filled 2010-12-27 (×2): qty 2

## 2010-12-27 MED ORDER — LACTATED RINGERS IV SOLN
INTRAVENOUS | Status: DC | PRN
  Administered 2010-12-27 (×2): via INTRAVENOUS

## 2010-12-27 MED ORDER — L-METHYLFOLATE-B6-B12 3-35-2 MG PO TABS
1.0000 | ORAL_TABLET | Freq: Every day | ORAL | Status: DC
  Administered 2010-12-27 – 2010-12-28 (×2): 1 via ORAL
  Filled 2010-12-27 (×2): qty 1

## 2010-12-27 MED ORDER — OXYCODONE-ACETAMINOPHEN 5-325 MG PO TABS: 1.0000 | ORAL_TABLET | ORAL | Status: DC | PRN

## 2010-12-27 MED ORDER — HYDROMORPHONE HCL PF 1 MG/ML IJ SOLN
INTRAMUSCULAR | Status: AC
  Filled 2010-12-27: qty 1

## 2010-12-27 MED ORDER — DOCUSATE SODIUM 100 MG PO CAPS
100.0000 mg | ORAL_CAPSULE | Freq: Two times a day (BID) | ORAL | Status: DC
  Administered 2010-12-28: 100 mg via ORAL
  Filled 2010-12-27: qty 1

## 2010-12-27 MED ORDER — BACITRACIN 50000 UNITS IM SOLR
INTRAMUSCULAR | Status: AC
  Filled 2010-12-27: qty 50000

## 2010-12-27 SURGICAL SUPPLY — 75 items
BAG DECANTER FOR FLEXI CONT (MISCELLANEOUS) ×2 IMPLANT
BANDAGE GAUZE ELAST BULKY 4 IN (GAUZE/BANDAGES/DRESSINGS) ×4 IMPLANT
BENZOIN TINCTURE PRP APPL 2/3 (GAUZE/BANDAGES/DRESSINGS) IMPLANT
BIT DRILL 14MM (INSTRUMENTS) ×1 IMPLANT
BIT DRILL NEURO 2X3.1 SFT TUCH (MISCELLANEOUS) ×1 IMPLANT
BLADE ULTRA TIP 2M (BLADE) ×2 IMPLANT
BUR BARREL STRAIGHT FLUTE 4.0 (BURR) ×2 IMPLANT
CAGE CERVICAL 8 (Cage) ×1 IMPLANT
CANISTER SUCTION 2500CC (MISCELLANEOUS) ×2 IMPLANT
CLOTH BEACON ORANGE TIMEOUT ST (SAFETY) ×2 IMPLANT
CONT SPEC 4OZ CLIKSEAL STRL BL (MISCELLANEOUS) ×2 IMPLANT
COVER MAYO STAND STRL (DRAPES) ×2 IMPLANT
DERMABOND ADVANCED (GAUZE/BANDAGES/DRESSINGS) ×1
DERMABOND ADVANCED .7 DNX12 (GAUZE/BANDAGES/DRESSINGS) ×1 IMPLANT
DRAPE LAPAROTOMY 100X72 PEDS (DRAPES) ×2 IMPLANT
DRAPE MICROSCOPE LEICA (MISCELLANEOUS) ×2 IMPLANT
DRAPE POUCH INSTRU U-SHP 10X18 (DRAPES) ×2 IMPLANT
DRAPE PROXIMA HALF (DRAPES) IMPLANT
DRESSING TELFA 8X3 (GAUZE/BANDAGES/DRESSINGS) IMPLANT
DRILL 14MM (INSTRUMENTS) ×2
DRILL NEURO 2X3.1 SOFT TOUCH (MISCELLANEOUS) ×2
DURAPREP 6ML APPLICATOR 50/CS (WOUND CARE) ×2 IMPLANT
ELECT COATED BLADE 2.86 ST (ELECTRODE) ×2 IMPLANT
ELECT REM PT RETURN 9FT ADLT (ELECTROSURGICAL) ×2
ELECTRODE REM PT RTRN 9FT ADLT (ELECTROSURGICAL) ×1 IMPLANT
GAUZE SPONGE 4X4 16PLY XRAY LF (GAUZE/BANDAGES/DRESSINGS) IMPLANT
GLOVE BIO SURGEON STRL SZ8 (GLOVE) ×2 IMPLANT
GLOVE BIOGEL M 8.0 STRL (GLOVE) ×2 IMPLANT
GLOVE BIOGEL PI IND STRL 7.0 (GLOVE) ×1 IMPLANT
GLOVE BIOGEL PI IND STRL 7.5 (GLOVE) ×1 IMPLANT
GLOVE BIOGEL PI IND STRL 8 (GLOVE) ×1 IMPLANT
GLOVE BIOGEL PI IND STRL 8.5 (GLOVE) ×1 IMPLANT
GLOVE BIOGEL PI INDICATOR 7.0 (GLOVE) ×1
GLOVE BIOGEL PI INDICATOR 7.5 (GLOVE) ×1
GLOVE BIOGEL PI INDICATOR 8 (GLOVE) ×1
GLOVE BIOGEL PI INDICATOR 8.5 (GLOVE) ×1
GLOVE ECLIPSE 7.5 STRL STRAW (GLOVE) ×10 IMPLANT
GLOVE EXAM NITRILE LRG STRL (GLOVE) IMPLANT
GLOVE EXAM NITRILE MD LF STRL (GLOVE) IMPLANT
GLOVE EXAM NITRILE XL STR (GLOVE) IMPLANT
GLOVE EXAM NITRILE XS STR PU (GLOVE) IMPLANT
GLOVE SURG SS PI 6.5 STRL IVOR (GLOVE) ×4 IMPLANT
GOWN BRE IMP SLV AUR LG STRL (GOWN DISPOSABLE) ×4 IMPLANT
GOWN BRE IMP SLV AUR XL STRL (GOWN DISPOSABLE) ×4 IMPLANT
GOWN STRL REIN 2XL LVL4 (GOWN DISPOSABLE) ×2 IMPLANT
HEAD HALTER (SOFTGOODS) ×2 IMPLANT
KIT BASIN OR (CUSTOM PROCEDURE TRAY) ×2 IMPLANT
KIT ROOM TURNOVER OR (KITS) ×2 IMPLANT
NEEDLE HYPO 18GX1.5 BLUNT FILL (NEEDLE) IMPLANT
NEEDLE HYPO 25X1 1.5 SAFETY (NEEDLE) ×2 IMPLANT
NEEDLE SPNL 18GX3.5 QUINCKE PK (NEEDLE) ×2 IMPLANT
NEEDLE SPNL 22GX3.5 QUINCKE BK (NEEDLE) ×2 IMPLANT
NS IRRIG 1000ML POUR BTL (IV SOLUTION) ×2 IMPLANT
PACK LAMINECTOMY NEURO (CUSTOM PROCEDURE TRAY) ×2 IMPLANT
PAD ARMBOARD 7.5X6 YLW CONV (MISCELLANEOUS) ×6 IMPLANT
PIN DISTRACTION 14MM (PIN) ×4 IMPLANT
PLATE 14MM (Plate) ×2 IMPLANT
PROFUSE BONE SIZE 2 (Bone Implant) ×2 IMPLANT
PUREGEN 0.5ML SML (Bone Implant) ×2 IMPLANT
RUBBERBAND STERILE (MISCELLANEOUS) ×4 IMPLANT
SCREW 14MM (Screw) ×8 IMPLANT
SPACER SPNL 7D LRG 16X14X8XNS (Cage) ×1 IMPLANT
SPCR SPNL 7D LRG 16X14X8XNS (Cage) ×1 IMPLANT
SPONGE GAUZE 4X4 12PLY (GAUZE/BANDAGES/DRESSINGS) IMPLANT
SPONGE INTESTINAL PEANUT (DISPOSABLE) ×2 IMPLANT
SPONGE SURGIFOAM ABS GEL SZ50 (HEMOSTASIS) ×2 IMPLANT
STAPLER SKIN PROX WIDE 3.9 (STAPLE) IMPLANT
STRIP CLOSURE SKIN 1/2X4 (GAUZE/BANDAGES/DRESSINGS) IMPLANT
SUT VIC AB 3-0 SH 8-18 (SUTURE) ×2 IMPLANT
SYR 20ML ECCENTRIC (SYRINGE) ×2 IMPLANT
SYR 3ML LL SCALE MARK (SYRINGE) ×2 IMPLANT
TOWEL OR 17X24 6PK STRL BLUE (TOWEL DISPOSABLE) ×2 IMPLANT
TOWEL OR 17X26 10 PK STRL BLUE (TOWEL DISPOSABLE) ×2 IMPLANT
TRAP SPECIMEN MUCOUS 40CC (MISCELLANEOUS) ×2 IMPLANT
WATER STERILE IRR 1000ML POUR (IV SOLUTION) ×2 IMPLANT

## 2010-12-27 NOTE — Interval H&P Note (Signed)
History and Physical Interval Note:  12/27/2010 11:52 AM  Hollice Gong  has presented today for surgery, with the diagnosis of CERVICAL HNP, CERVICAL RADICULOPATHY, NECK PAIN  The various methods of treatment have been discussed with the patient and family. After consideration of risks, benefits and other options for treatment, the patient has consented to  Procedure(s): ANTERIOR CERVICAL DECOMPRESSION/DISCECTOMY FUSION 1 LEVEL as a surgical intervention .  The patients' history has been reviewed, patient examined, no change in status, stable for surgery.  I have reviewed the patients' chart and labs.  Questions were answered to the patient's satisfaction.     Akyia Borelli D  Date of Initial H&P: 11/24/2010  History reviewed, patient examined, no change in status, stable for surgery.

## 2010-12-27 NOTE — Anesthesia Postprocedure Evaluation (Signed)
  Anesthesia Post-op Note  Patient: Todd Hall  Procedure(s) Performed:  ANTERIOR CERVICAL DECOMPRESSION/DISCECTOMY FUSION 1 LEVEL - Cervical six-seven anterior cervical decompression fusion with interbody prosthesis and plating  Patient Location: PACU  Anesthesia Type: General  Level of Consciousness: awake  Airway and Oxygen Therapy: Patient Spontanous Breathing  Post-op Pain: mild  Post-op Assessment: Post-op Vital signs reviewed  Post-op Vital Signs: stable  Complications: No apparent anesthesia complications

## 2010-12-27 NOTE — Preoperative (Addendum)
Beta Blockers   Reason not to administer Beta Blockers:Pt took coreg 12-26-10 at approx 22:00

## 2010-12-27 NOTE — Anesthesia Preprocedure Evaluation (Addendum)
Anesthesia Evaluation  Patient identified by MRN, date of birth, ID band Patient awake    Reviewed: Allergy & Precautions, H&P , NPO status , Patient's Chart, lab work & pertinent test results, reviewed documented beta blocker date and time   Airway Mallampati: I TM Distance: >3 FB Neck ROM: Full    Dental  (+) Edentulous Upper and Edentulous Lower   Pulmonary  clear to auscultation        Cardiovascular hypertension, Pt. on home beta blockers and Pt. on medications + angina + Past MI Regular Normal Pt reports h/o chest pain approz 15 years ago. Cardiac cath done at that time- no interventions at that time per patient.   Neuro/Psych    GI/Hepatic GERD-  ,  Endo/Other  Diabetes mellitus-, Well Controlled, Type 2, Insulin DependentHypothyroidism   Renal/GU Renal InsufficiencyRenal disease     Musculoskeletal   Abdominal   Peds  Hematology   Anesthesia Other Findings   Reproductive/Obstetrics                         Anesthesia Physical Anesthesia Plan  ASA: III  Anesthesia Plan: General   Post-op Pain Management:    Induction: Intravenous  Airway Management Planned: Oral ETT  Additional Equipment:   Intra-op Plan:   Post-operative Plan: Extubation in OR  Informed Consent: I have reviewed the patients History and Physical, chart, labs and discussed the procedure including the risks, benefits and alternatives for the proposed anesthesia with the patient or authorized representative who has indicated his/her understanding and acceptance.     Plan Discussed with: CRNA and Surgeon  Anesthesia Plan Comments:         Anesthesia Quick Evaluation

## 2010-12-27 NOTE — Transfer of Care (Signed)
Immediate Anesthesia Transfer of Care Note  Patient: Todd Hall  Procedure(s) Performed:  ANTERIOR CERVICAL DECOMPRESSION/DISCECTOMY FUSION 1 LEVEL - Cervical six-seven anterior cervical decompression fusion with interbody prosthesis and plating  Patient Location: PACU  Anesthesia Type: General  Level of Consciousness: sedated  Airway & Oxygen Therapy: Patient Spontanous Breathing and Patient connected to face mask oxygen  Post-op Assessment: Report given to PACU RN, Post -op Vital signs reviewed and stable and Patient moving all extremities  Post vital signs: Reviewed and stable  Complications: No apparent anesthesia complications

## 2010-12-27 NOTE — Op Note (Signed)
12/27/2010  1:34 PM  PATIENT:  Todd Hall  65 y.o. male  PRE-OPERATIVE DIAGNOSIS:  Cervical six-seven herniated nucleus pulposus; Cervical seven radiculopathy  POST-OPERATIVE DIAGNOSIS:  Cervical six-seven herniated nucleus pulposus; Cervical seven radiculopathy, Cervical stenosis and Myelopathy    PROCEDURE:  Procedure(s): ANTERIOR CERVICAL DECOMPRESSION/DISCECTOMY FUSION 1 LEVEL  SURGEON:  Surgeon(s): Dorian Heckle, MD Tanya Nones Botero  PHYSICIAN ASSISTANT:   ASSISTANTS: Poteat, RN   ANESTHESIA:   general  EBL:  Total I/O In: 1000 [I.V.:1000] Out: -   BLOOD ADMINISTERED:none  DRAINS: none   LOCAL MEDICATIONS USED:  LIDOCAINE 7CC  SPECIMEN:  No Specimen  DISPOSITION OF SPECIMEN:  N/A  COUNTS:  YES  TOURNIQUET:  * No tourniquets in log *  DICTATION: Patient was brought to operating room and following the smooth and uncomplicated induction of general endotracheal anesthesia his head was placed on a horseshoe head holder he was placed in 5 pounds of Holter traction and his anterior neck was prepped and draped in usual sterile fashion. An incision was made on the left side of midline after infiltrating the skin and subcutaneous tissues with local lidocaine. The platysmal layer was incised and subplatysmal dissection was performed exposing the anterior border sternocleidomastoid muscle. Using blunt dissection the carotid sheath was kept lateral and trachea and esophagus kept medial exposing the anterior cervical spine. A bent spinal needle was placed it was felt to be the C6-7 level and this was confirmed on intraoperative x-ray. Longus coli muscles were taken down from the anterior cervical spine using electrocautery and key elevator and self-retaining retractor was placed. The interspace was incised and a thorough discectomy was performed. Distraction pins were placed. Uncinate spurs and central spondylitic ridges were drilled down with a high-speed drill. The spinal cord  dura and both C7 nerve roots were widely decompressed. Hemostasis was assured. After trial sizing an 8 mm peek interbody cage was selected and packed with profuse block and autograft. Tamped into position and countersunk appropriately. Distraction weight was removed. A 14 mm trestle luxe anterior cervical plate was affixed to the cervical spine with 14 mm variable-angle screws 2 at C6 and 2 at C7. All screws were well-positioned and locking mechanisms were engaged. Soft tissues were inspected and found to be in good repair. The wound was irrigated. The platysma layer was closed with 3-0 Vicryl stitches and the skin was reapproximated with 3-0 Vicryl subcuticular stitches. The wound was dressed with Dermabond. Counts were correct at the end of the case. Patient was extubated and taken to recovery in stable and satisfactory condition.  PLAN OF CARE: Admit to inpatient   PATIENT DISPOSITION:  PACU - hemodynamically stable.   Delay start of Pharmacological VTE agent (>24hrs) due to surgical blood loss or risk of bleeding: YES

## 2010-12-28 NOTE — Progress Notes (Signed)
Subjective: Patient reports "Doing alright; a little sore in my shoulders"  Objective: Vital signs in last 24 hours: Temp:  [97 F (36.1 C)-98.4 F (36.9 C)] 98.1 F (36.7 C) (12/12 0800) Pulse Rate:  [67-91] 67  (12/12 0800) Resp:  [8-20] 18  (12/12 0800) BP: (124-191)/(67-97) 166/80 mmHg (12/12 0800) SpO2:  [96 %-100 %] 98 % (12/12 0800)  Intake/Output from previous day: 12/11 0701 - 12/12 0700 In: 1860 [P.O.:360; I.V.:1500] Out: 75 [Urine:75] Intake/Output this shift:    Alert, conversant. Good strength BUE. Denies neck, arm pain. Trapezeii soreness only - reassured. Incision with Dermabond. No erythema, swelling, or drainage..  Lab Results: No results found for this basename: WBC:2,HGB:2,HCT:2,PLT:2 in the last 72 hours BMET No results found for this basename: NA:2,K:2,CL:2,CO2:2,GLUCOSE:2,BUN:2,CREATININE:2,CALCIUM:2 in the last 72 hours  Studies/Results: Dg Cervical Spine 2-3 Views  12/27/2010  *RADIOLOGY REPORT*  Clinical Data: C6-7 ACDF.  CERVICAL SPINE - 2-3 VIEW  Comparison: 11/04/2010 MRI  Findings: First intraoperative spot image demonstrates localizing instrument anteriorly at C6-7.  Second lateral intraoperative image demonstrates changes of anterior fusion at C6-7.  The C7 vertebral body and lower aspect of the anterior plate cannot be visualized due to overlying shoulders.  IMPRESSION: C6-7 ACDF.  Original Report Authenticated By: Cyndie Chime, M.D.    Assessment/Plan: Improved. Assessment as above.  LOS: 1 day  D/C IV. D/C to home. Pt verballised understanding of instructions: No lifting >10lbs, ok to shower. No lotions, creams, ointments to incision. No driving [id number]. Pt will call office today to set up appt with Dr. Venetia Maxon in 3-4wks.   Georgiann Cocker 12/28/2010, 9:35 AM

## 2010-12-28 NOTE — Discharge Summary (Signed)
Physician Discharge Summary  Patient ID: Todd Hall MRN: [id number] DOB/AGE: [birth year] 65 y.o.  Admit date: 12/27/2010 Discharge date: 12/28/2010  Admission Diagnoses:  Discharge Diagnoses:  Active Problems:  * No active hospital problems. *    Discharged Condition: good  Hospital Course: ACDF C6/7.  Multiple co morbidities.  Consults: none  Significant Diagnostic Studies: None  Treatments: ACDF C6/7.  Discharge Exam: Blood pressure 166/80, pulse 67, temperature 98.1 F (36.7 C), temperature source Oral, resp. rate 18, SpO2 98.00%. Neurologic: Alert and oriented X 3, normal strength and tone. Normal symmetric reflexes. Normal coordination and gait Incision/Wound:C/D/I  Disposition: Final discharge disposition not confirmed   Current Discharge Medication List    CONTINUE these medications which have NOT CHANGED   Details  carvedilol (COREG CR) 20 MG 24 hr capsule Take 20 mg by mouth daily.      doxercalciferol (HECTOROL) 0.5 MCG capsule Take 1 mcg by mouth daily.      furosemide (LASIX) 40 MG tablet Take 40 mg by mouth daily.      HYDROcodone-acetaminophen (VICODIN) 5-500 MG per tablet Take 1 tablet by mouth every 6 (six) hours as needed. For pain    insulin lispro protamine-insulin lispro (HUMALOG 50/50) (50-50) 100 UNIT/ML SUSP Inject 22-24 Units into the skin 2 (two) times daily before a meal. 24 units in the a.m. And 22 units in the p.m.     levothyroxine (SYNTHROID, LEVOTHROID) 100 MCG tablet Take 100 mcg by mouth daily.      omeprazole (PRILOSEC) 20 MG capsule Take 20 mg by mouth daily.      rosuvastatin (CRESTOR) 10 MG tablet Take 10 mg by mouth daily.      telmisartan (MICARDIS) 80 MG tablet Take 40 mg by mouth daily.      l-methylfolate-B6-B12 (METANX) 3-35-2 MG TABS Take 1 tablet by mouth daily.      sodium bicarbonate 650 MG tablet Take 650 mg by mouth 2 (two) times daily.           SignedDorian Heckle 12/28/2010, 9:48 AM

## 2010-12-29 ENCOUNTER — Encounter (HOSPITAL_COMMUNITY): Payer: Self-pay | Admitting: Neurosurgery

## 2011-01-18 DIAGNOSIS — M5020 Other cervical disc displacement, unspecified cervical region: Secondary | ICD-10-CM | POA: Diagnosis not present

## 2011-01-19 DIAGNOSIS — J209 Acute bronchitis, unspecified: Secondary | ICD-10-CM | POA: Diagnosis not present

## 2011-01-19 DIAGNOSIS — J019 Acute sinusitis, unspecified: Secondary | ICD-10-CM | POA: Diagnosis not present

## 2011-01-24 DIAGNOSIS — N189 Chronic kidney disease, unspecified: Secondary | ICD-10-CM | POA: Diagnosis not present

## 2011-01-24 DIAGNOSIS — Z125 Encounter for screening for malignant neoplasm of prostate: Secondary | ICD-10-CM | POA: Diagnosis not present

## 2011-01-24 DIAGNOSIS — E789 Disorder of lipoprotein metabolism, unspecified: Secondary | ICD-10-CM | POA: Diagnosis not present

## 2011-01-24 DIAGNOSIS — E119 Type 2 diabetes mellitus without complications: Secondary | ICD-10-CM | POA: Diagnosis not present

## 2011-02-01 DIAGNOSIS — C029 Malignant neoplasm of tongue, unspecified: Secondary | ICD-10-CM | POA: Diagnosis not present

## 2011-02-01 DIAGNOSIS — E789 Disorder of lipoprotein metabolism, unspecified: Secondary | ICD-10-CM | POA: Diagnosis not present

## 2011-02-01 DIAGNOSIS — E119 Type 2 diabetes mellitus without complications: Secondary | ICD-10-CM | POA: Diagnosis not present

## 2011-02-01 DIAGNOSIS — M542 Cervicalgia: Secondary | ICD-10-CM | POA: Diagnosis not present

## 2011-02-01 DIAGNOSIS — I10 Essential (primary) hypertension: Secondary | ICD-10-CM | POA: Diagnosis not present

## 2011-03-01 DIAGNOSIS — I120 Hypertensive chronic kidney disease with stage 5 chronic kidney disease or end stage renal disease: Secondary | ICD-10-CM | POA: Diagnosis not present

## 2011-03-01 DIAGNOSIS — M5020 Other cervical disc displacement, unspecified cervical region: Secondary | ICD-10-CM | POA: Diagnosis not present

## 2011-03-23 DIAGNOSIS — I120 Hypertensive chronic kidney disease with stage 5 chronic kidney disease or end stage renal disease: Secondary | ICD-10-CM | POA: Diagnosis not present

## 2011-04-11 DIAGNOSIS — I120 Hypertensive chronic kidney disease with stage 5 chronic kidney disease or end stage renal disease: Secondary | ICD-10-CM | POA: Diagnosis not present

## 2011-04-19 DIAGNOSIS — I120 Hypertensive chronic kidney disease with stage 5 chronic kidney disease or end stage renal disease: Secondary | ICD-10-CM | POA: Diagnosis not present

## 2011-04-27 DIAGNOSIS — I10 Essential (primary) hypertension: Secondary | ICD-10-CM | POA: Diagnosis not present

## 2011-04-27 DIAGNOSIS — D649 Anemia, unspecified: Secondary | ICD-10-CM | POA: Diagnosis not present

## 2011-04-27 DIAGNOSIS — E213 Hyperparathyroidism, unspecified: Secondary | ICD-10-CM | POA: Diagnosis not present

## 2011-05-01 DIAGNOSIS — D509 Iron deficiency anemia, unspecified: Secondary | ICD-10-CM | POA: Diagnosis not present

## 2011-05-08 DIAGNOSIS — E119 Type 2 diabetes mellitus without complications: Secondary | ICD-10-CM | POA: Diagnosis not present

## 2011-05-08 DIAGNOSIS — R279 Unspecified lack of coordination: Secondary | ICD-10-CM | POA: Diagnosis not present

## 2011-05-08 DIAGNOSIS — R55 Syncope and collapse: Secondary | ICD-10-CM | POA: Diagnosis not present

## 2011-05-08 DIAGNOSIS — N189 Chronic kidney disease, unspecified: Secondary | ICD-10-CM | POA: Diagnosis not present

## 2011-05-08 DIAGNOSIS — R609 Edema, unspecified: Secondary | ICD-10-CM | POA: Diagnosis not present

## 2011-05-10 DIAGNOSIS — I120 Hypertensive chronic kidney disease with stage 5 chronic kidney disease or end stage renal disease: Secondary | ICD-10-CM | POA: Diagnosis not present

## 2011-05-10 DIAGNOSIS — R55 Syncope and collapse: Secondary | ICD-10-CM | POA: Diagnosis not present

## 2011-05-26 DIAGNOSIS — N189 Chronic kidney disease, unspecified: Secondary | ICD-10-CM | POA: Diagnosis not present

## 2011-05-26 DIAGNOSIS — D472 Monoclonal gammopathy: Secondary | ICD-10-CM | POA: Diagnosis not present

## 2011-05-26 DIAGNOSIS — D631 Anemia in chronic kidney disease: Secondary | ICD-10-CM | POA: Diagnosis not present

## 2011-05-30 DIAGNOSIS — E119 Type 2 diabetes mellitus without complications: Secondary | ICD-10-CM | POA: Diagnosis not present

## 2011-05-30 DIAGNOSIS — E789 Disorder of lipoprotein metabolism, unspecified: Secondary | ICD-10-CM | POA: Diagnosis not present

## 2011-06-01 DIAGNOSIS — N039 Chronic nephritic syndrome with unspecified morphologic changes: Secondary | ICD-10-CM | POA: Diagnosis not present

## 2011-06-01 DIAGNOSIS — E119 Type 2 diabetes mellitus without complications: Secondary | ICD-10-CM | POA: Diagnosis not present

## 2011-06-01 DIAGNOSIS — D472 Monoclonal gammopathy: Secondary | ICD-10-CM | POA: Diagnosis not present

## 2011-06-01 DIAGNOSIS — D631 Anemia in chronic kidney disease: Secondary | ICD-10-CM | POA: Diagnosis not present

## 2011-06-01 DIAGNOSIS — N189 Chronic kidney disease, unspecified: Secondary | ICD-10-CM | POA: Diagnosis not present

## 2011-06-01 DIAGNOSIS — I10 Essential (primary) hypertension: Secondary | ICD-10-CM | POA: Diagnosis not present

## 2011-06-01 DIAGNOSIS — E789 Disorder of lipoprotein metabolism, unspecified: Secondary | ICD-10-CM | POA: Diagnosis not present

## 2011-06-05 DIAGNOSIS — R55 Syncope and collapse: Secondary | ICD-10-CM | POA: Diagnosis not present

## 2011-06-19 ENCOUNTER — Other Ambulatory Visit: Payer: Self-pay | Admitting: Neurology

## 2011-06-19 DIAGNOSIS — R55 Syncope and collapse: Secondary | ICD-10-CM

## 2011-06-20 DIAGNOSIS — E213 Hyperparathyroidism, unspecified: Secondary | ICD-10-CM | POA: Diagnosis not present

## 2011-06-20 DIAGNOSIS — I129 Hypertensive chronic kidney disease with stage 1 through stage 4 chronic kidney disease, or unspecified chronic kidney disease: Secondary | ICD-10-CM | POA: Diagnosis not present

## 2011-06-20 DIAGNOSIS — I10 Essential (primary) hypertension: Secondary | ICD-10-CM | POA: Diagnosis not present

## 2011-06-20 DIAGNOSIS — D649 Anemia, unspecified: Secondary | ICD-10-CM | POA: Diagnosis not present

## 2011-06-20 DIAGNOSIS — N2581 Secondary hyperparathyroidism of renal origin: Secondary | ICD-10-CM | POA: Diagnosis not present

## 2011-06-26 DIAGNOSIS — E119 Type 2 diabetes mellitus without complications: Secondary | ICD-10-CM | POA: Diagnosis not present

## 2011-07-03 DIAGNOSIS — E789 Disorder of lipoprotein metabolism, unspecified: Secondary | ICD-10-CM | POA: Diagnosis not present

## 2011-07-03 DIAGNOSIS — E119 Type 2 diabetes mellitus without complications: Secondary | ICD-10-CM | POA: Diagnosis not present

## 2011-07-04 DIAGNOSIS — D509 Iron deficiency anemia, unspecified: Secondary | ICD-10-CM | POA: Diagnosis not present

## 2011-07-04 DIAGNOSIS — I129 Hypertensive chronic kidney disease with stage 1 through stage 4 chronic kidney disease, or unspecified chronic kidney disease: Secondary | ICD-10-CM | POA: Diagnosis not present

## 2011-07-13 ENCOUNTER — Ambulatory Visit
Admission: RE | Admit: 2011-07-13 | Discharge: 2011-07-13 | Disposition: A | Discharge status: Home / Self Care | Payer: Medicare Other | Source: Ambulatory Visit | Attending: Neurology | Admitting: Neurology

## 2011-07-13 DIAGNOSIS — R55 Syncope and collapse: Secondary | ICD-10-CM | POA: Diagnosis not present

## 2011-10-02 DIAGNOSIS — E119 Type 2 diabetes mellitus without complications: Secondary | ICD-10-CM | POA: Diagnosis not present

## 2011-10-02 DIAGNOSIS — E789 Disorder of lipoprotein metabolism, unspecified: Secondary | ICD-10-CM | POA: Diagnosis not present

## 2011-10-09 DIAGNOSIS — E119 Type 2 diabetes mellitus without complications: Secondary | ICD-10-CM | POA: Diagnosis not present

## 2011-10-09 DIAGNOSIS — E789 Disorder of lipoprotein metabolism, unspecified: Secondary | ICD-10-CM | POA: Diagnosis not present

## 2011-10-09 DIAGNOSIS — E875 Hyperkalemia: Secondary | ICD-10-CM | POA: Diagnosis not present

## 2011-11-24 DIAGNOSIS — N189 Chronic kidney disease, unspecified: Secondary | ICD-10-CM | POA: Diagnosis not present

## 2011-11-24 DIAGNOSIS — D472 Monoclonal gammopathy: Secondary | ICD-10-CM | POA: Diagnosis not present

## 2011-11-24 DIAGNOSIS — D631 Anemia in chronic kidney disease: Secondary | ICD-10-CM | POA: Diagnosis not present

## 2011-12-01 DIAGNOSIS — D631 Anemia in chronic kidney disease: Secondary | ICD-10-CM | POA: Diagnosis not present

## 2011-12-01 DIAGNOSIS — N039 Chronic nephritic syndrome with unspecified morphologic changes: Secondary | ICD-10-CM | POA: Diagnosis not present

## 2011-12-01 DIAGNOSIS — D472 Monoclonal gammopathy: Secondary | ICD-10-CM | POA: Diagnosis not present

## 2011-12-01 DIAGNOSIS — N189 Chronic kidney disease, unspecified: Secondary | ICD-10-CM | POA: Diagnosis not present

## 2012-02-12 DIAGNOSIS — E119 Type 2 diabetes mellitus without complications: Secondary | ICD-10-CM | POA: Diagnosis not present

## 2012-02-12 DIAGNOSIS — Z23 Encounter for immunization: Secondary | ICD-10-CM | POA: Diagnosis not present

## 2012-02-12 DIAGNOSIS — D649 Anemia, unspecified: Secondary | ICD-10-CM | POA: Diagnosis not present

## 2012-02-12 DIAGNOSIS — I10 Essential (primary) hypertension: Secondary | ICD-10-CM | POA: Diagnosis not present

## 2012-02-12 DIAGNOSIS — E785 Hyperlipidemia, unspecified: Secondary | ICD-10-CM | POA: Diagnosis not present

## 2012-02-12 DIAGNOSIS — E213 Hyperparathyroidism, unspecified: Secondary | ICD-10-CM | POA: Diagnosis not present

## 2012-02-12 DIAGNOSIS — N2581 Secondary hyperparathyroidism of renal origin: Secondary | ICD-10-CM | POA: Diagnosis not present

## 2012-02-12 DIAGNOSIS — E039 Hypothyroidism, unspecified: Secondary | ICD-10-CM | POA: Diagnosis not present

## 2012-02-12 DIAGNOSIS — I129 Hypertensive chronic kidney disease with stage 1 through stage 4 chronic kidney disease, or unspecified chronic kidney disease: Secondary | ICD-10-CM | POA: Diagnosis not present

## 2012-02-20 DIAGNOSIS — D509 Iron deficiency anemia, unspecified: Secondary | ICD-10-CM | POA: Diagnosis not present

## 2012-02-22 DIAGNOSIS — I120 Hypertensive chronic kidney disease with stage 5 chronic kidney disease or end stage renal disease: Secondary | ICD-10-CM | POA: Diagnosis not present

## 2012-03-12 DIAGNOSIS — I120 Hypertensive chronic kidney disease with stage 5 chronic kidney disease or end stage renal disease: Secondary | ICD-10-CM | POA: Diagnosis not present

## 2012-03-14 DIAGNOSIS — E789 Disorder of lipoprotein metabolism, unspecified: Secondary | ICD-10-CM | POA: Diagnosis not present

## 2012-03-14 DIAGNOSIS — E119 Type 2 diabetes mellitus without complications: Secondary | ICD-10-CM | POA: Diagnosis not present

## 2012-03-14 DIAGNOSIS — I10 Essential (primary) hypertension: Secondary | ICD-10-CM | POA: Diagnosis not present

## 2012-03-14 DIAGNOSIS — E0789 Other specified disorders of thyroid: Secondary | ICD-10-CM | POA: Diagnosis not present

## 2012-03-14 DIAGNOSIS — Z125 Encounter for screening for malignant neoplasm of prostate: Secondary | ICD-10-CM | POA: Diagnosis not present

## 2012-03-20 DIAGNOSIS — H2510 Age-related nuclear cataract, unspecified eye: Secondary | ICD-10-CM | POA: Diagnosis not present

## 2012-03-20 DIAGNOSIS — E11319 Type 2 diabetes mellitus with unspecified diabetic retinopathy without macular edema: Secondary | ICD-10-CM | POA: Diagnosis not present

## 2012-03-20 DIAGNOSIS — H3581 Retinal edema: Secondary | ICD-10-CM | POA: Diagnosis not present

## 2012-03-20 DIAGNOSIS — E1139 Type 2 diabetes mellitus with other diabetic ophthalmic complication: Secondary | ICD-10-CM | POA: Diagnosis not present

## 2012-03-21 DIAGNOSIS — R030 Elevated blood-pressure reading, without diagnosis of hypertension: Secondary | ICD-10-CM | POA: Diagnosis not present

## 2012-03-21 DIAGNOSIS — I10 Essential (primary) hypertension: Secondary | ICD-10-CM | POA: Diagnosis not present

## 2012-03-21 DIAGNOSIS — C029 Malignant neoplasm of tongue, unspecified: Secondary | ICD-10-CM | POA: Diagnosis not present

## 2012-03-21 DIAGNOSIS — E789 Disorder of lipoprotein metabolism, unspecified: Secondary | ICD-10-CM | POA: Diagnosis not present

## 2012-03-21 DIAGNOSIS — E119 Type 2 diabetes mellitus without complications: Secondary | ICD-10-CM | POA: Diagnosis not present

## 2012-03-27 DIAGNOSIS — I120 Hypertensive chronic kidney disease with stage 5 chronic kidney disease or end stage renal disease: Secondary | ICD-10-CM | POA: Diagnosis not present

## 2012-04-29 DIAGNOSIS — H3509 Other intraretinal microvascular abnormalities: Secondary | ICD-10-CM | POA: Diagnosis not present

## 2012-04-29 DIAGNOSIS — E11349 Type 2 diabetes mellitus with severe nonproliferative diabetic retinopathy without macular edema: Secondary | ICD-10-CM | POA: Diagnosis not present

## 2012-04-29 DIAGNOSIS — E1139 Type 2 diabetes mellitus with other diabetic ophthalmic complication: Secondary | ICD-10-CM | POA: Diagnosis not present

## 2012-04-29 DIAGNOSIS — H43829 Vitreomacular adhesion, unspecified eye: Secondary | ICD-10-CM | POA: Diagnosis not present

## 2012-05-01 DIAGNOSIS — E119 Type 2 diabetes mellitus without complications: Secondary | ICD-10-CM | POA: Diagnosis not present

## 2012-05-01 DIAGNOSIS — N2581 Secondary hyperparathyroidism of renal origin: Secondary | ICD-10-CM | POA: Diagnosis not present

## 2012-05-01 DIAGNOSIS — E1142 Type 2 diabetes mellitus with diabetic polyneuropathy: Secondary | ICD-10-CM | POA: Diagnosis not present

## 2012-05-01 DIAGNOSIS — I10 Essential (primary) hypertension: Secondary | ICD-10-CM | POA: Diagnosis not present

## 2012-05-23 DIAGNOSIS — H2510 Age-related nuclear cataract, unspecified eye: Secondary | ICD-10-CM | POA: Diagnosis not present

## 2012-05-23 DIAGNOSIS — H35039 Hypertensive retinopathy, unspecified eye: Secondary | ICD-10-CM | POA: Diagnosis not present

## 2012-05-23 DIAGNOSIS — H01009 Unspecified blepharitis unspecified eye, unspecified eyelid: Secondary | ICD-10-CM | POA: Diagnosis not present

## 2012-05-23 DIAGNOSIS — H353190 Nonexudative age-related macular degeneration, unspecified eye, stage unspecified: Secondary | ICD-10-CM | POA: Diagnosis not present

## 2012-05-23 DIAGNOSIS — E11319 Type 2 diabetes mellitus with unspecified diabetic retinopathy without macular edema: Secondary | ICD-10-CM | POA: Diagnosis not present

## 2012-05-23 DIAGNOSIS — E119 Type 2 diabetes mellitus without complications: Secondary | ICD-10-CM | POA: Diagnosis not present

## 2012-05-28 DIAGNOSIS — N189 Chronic kidney disease, unspecified: Secondary | ICD-10-CM | POA: Diagnosis not present

## 2012-05-28 DIAGNOSIS — D472 Monoclonal gammopathy: Secondary | ICD-10-CM | POA: Diagnosis not present

## 2012-05-28 DIAGNOSIS — D631 Anemia in chronic kidney disease: Secondary | ICD-10-CM | POA: Diagnosis not present

## 2012-06-03 DIAGNOSIS — N039 Chronic nephritic syndrome with unspecified morphologic changes: Secondary | ICD-10-CM | POA: Diagnosis not present

## 2012-06-03 DIAGNOSIS — D631 Anemia in chronic kidney disease: Secondary | ICD-10-CM | POA: Diagnosis not present

## 2012-06-03 DIAGNOSIS — D472 Monoclonal gammopathy: Secondary | ICD-10-CM | POA: Diagnosis not present

## 2012-06-03 DIAGNOSIS — N189 Chronic kidney disease, unspecified: Secondary | ICD-10-CM | POA: Diagnosis not present

## 2012-07-15 DIAGNOSIS — E119 Type 2 diabetes mellitus without complications: Secondary | ICD-10-CM | POA: Diagnosis not present

## 2012-07-15 DIAGNOSIS — E789 Disorder of lipoprotein metabolism, unspecified: Secondary | ICD-10-CM | POA: Diagnosis not present

## 2012-07-22 DIAGNOSIS — E789 Disorder of lipoprotein metabolism, unspecified: Secondary | ICD-10-CM | POA: Diagnosis not present

## 2012-07-22 DIAGNOSIS — I10 Essential (primary) hypertension: Secondary | ICD-10-CM | POA: Diagnosis not present

## 2012-07-22 DIAGNOSIS — E119 Type 2 diabetes mellitus without complications: Secondary | ICD-10-CM | POA: Diagnosis not present

## 2012-08-12 DIAGNOSIS — I129 Hypertensive chronic kidney disease with stage 1 through stage 4 chronic kidney disease, or unspecified chronic kidney disease: Secondary | ICD-10-CM | POA: Diagnosis not present

## 2012-08-12 DIAGNOSIS — N2581 Secondary hyperparathyroidism of renal origin: Secondary | ICD-10-CM | POA: Diagnosis not present

## 2012-08-12 DIAGNOSIS — E119 Type 2 diabetes mellitus without complications: Secondary | ICD-10-CM | POA: Diagnosis not present

## 2012-08-12 DIAGNOSIS — D649 Anemia, unspecified: Secondary | ICD-10-CM | POA: Diagnosis not present

## 2012-09-25 DIAGNOSIS — E119 Type 2 diabetes mellitus without complications: Secondary | ICD-10-CM | POA: Diagnosis not present

## 2012-10-02 DIAGNOSIS — E875 Hyperkalemia: Secondary | ICD-10-CM | POA: Diagnosis not present

## 2012-10-02 DIAGNOSIS — E789 Disorder of lipoprotein metabolism, unspecified: Secondary | ICD-10-CM | POA: Diagnosis not present

## 2012-10-02 DIAGNOSIS — E119 Type 2 diabetes mellitus without complications: Secondary | ICD-10-CM | POA: Diagnosis not present

## 2012-10-02 DIAGNOSIS — Z23 Encounter for immunization: Secondary | ICD-10-CM | POA: Diagnosis not present

## 2012-10-22 DIAGNOSIS — E875 Hyperkalemia: Secondary | ICD-10-CM | POA: Diagnosis not present

## 2012-10-31 DIAGNOSIS — I10 Essential (primary) hypertension: Secondary | ICD-10-CM | POA: Diagnosis not present

## 2012-10-31 DIAGNOSIS — E119 Type 2 diabetes mellitus without complications: Secondary | ICD-10-CM | POA: Diagnosis not present

## 2012-11-27 DIAGNOSIS — D631 Anemia in chronic kidney disease: Secondary | ICD-10-CM | POA: Diagnosis not present

## 2012-11-27 DIAGNOSIS — N189 Chronic kidney disease, unspecified: Secondary | ICD-10-CM | POA: Diagnosis not present

## 2012-11-27 DIAGNOSIS — D472 Monoclonal gammopathy: Secondary | ICD-10-CM | POA: Diagnosis not present

## 2012-12-03 DIAGNOSIS — N189 Chronic kidney disease, unspecified: Secondary | ICD-10-CM | POA: Diagnosis not present

## 2012-12-03 DIAGNOSIS — D631 Anemia in chronic kidney disease: Secondary | ICD-10-CM | POA: Diagnosis not present

## 2012-12-03 DIAGNOSIS — D472 Monoclonal gammopathy: Secondary | ICD-10-CM | POA: Diagnosis not present

## 2012-12-04 DIAGNOSIS — E119 Type 2 diabetes mellitus without complications: Secondary | ICD-10-CM | POA: Diagnosis not present

## 2012-12-20 DIAGNOSIS — E875 Hyperkalemia: Secondary | ICD-10-CM | POA: Diagnosis not present

## 2012-12-20 DIAGNOSIS — N189 Chronic kidney disease, unspecified: Secondary | ICD-10-CM | POA: Diagnosis not present

## 2012-12-20 DIAGNOSIS — E119 Type 2 diabetes mellitus without complications: Secondary | ICD-10-CM | POA: Diagnosis not present

## 2012-12-20 DIAGNOSIS — E789 Disorder of lipoprotein metabolism, unspecified: Secondary | ICD-10-CM | POA: Diagnosis not present

## 2013-02-17 DIAGNOSIS — Z23 Encounter for immunization: Secondary | ICD-10-CM | POA: Diagnosis not present

## 2013-02-17 DIAGNOSIS — N183 Chronic kidney disease, stage 3 unspecified: Secondary | ICD-10-CM | POA: Diagnosis not present

## 2013-02-17 DIAGNOSIS — N2581 Secondary hyperparathyroidism of renal origin: Secondary | ICD-10-CM | POA: Diagnosis not present

## 2013-02-17 DIAGNOSIS — I129 Hypertensive chronic kidney disease with stage 1 through stage 4 chronic kidney disease, or unspecified chronic kidney disease: Secondary | ICD-10-CM | POA: Diagnosis not present

## 2013-02-17 DIAGNOSIS — D631 Anemia in chronic kidney disease: Secondary | ICD-10-CM | POA: Diagnosis not present

## 2013-03-03 DIAGNOSIS — N183 Chronic kidney disease, stage 3 unspecified: Secondary | ICD-10-CM | POA: Diagnosis not present

## 2013-06-06 DIAGNOSIS — E789 Disorder of lipoprotein metabolism, unspecified: Secondary | ICD-10-CM | POA: Diagnosis not present

## 2013-06-06 DIAGNOSIS — E119 Type 2 diabetes mellitus without complications: Secondary | ICD-10-CM | POA: Diagnosis not present

## 2013-06-06 DIAGNOSIS — I10 Essential (primary) hypertension: Secondary | ICD-10-CM | POA: Diagnosis not present

## 2013-06-06 DIAGNOSIS — Z125 Encounter for screening for malignant neoplasm of prostate: Secondary | ICD-10-CM | POA: Diagnosis not present

## 2013-06-06 DIAGNOSIS — E0789 Other specified disorders of thyroid: Secondary | ICD-10-CM | POA: Diagnosis not present

## 2013-06-17 DIAGNOSIS — E875 Hyperkalemia: Secondary | ICD-10-CM | POA: Diagnosis not present

## 2013-06-17 DIAGNOSIS — E119 Type 2 diabetes mellitus without complications: Secondary | ICD-10-CM | POA: Diagnosis not present

## 2013-06-17 DIAGNOSIS — E789 Disorder of lipoprotein metabolism, unspecified: Secondary | ICD-10-CM | POA: Diagnosis not present

## 2013-06-17 DIAGNOSIS — I10 Essential (primary) hypertension: Secondary | ICD-10-CM | POA: Diagnosis not present

## 2013-07-17 DIAGNOSIS — E119 Type 2 diabetes mellitus without complications: Secondary | ICD-10-CM | POA: Diagnosis not present

## 2013-08-06 DIAGNOSIS — N2581 Secondary hyperparathyroidism of renal origin: Secondary | ICD-10-CM | POA: Diagnosis not present

## 2013-08-06 DIAGNOSIS — I10 Essential (primary) hypertension: Secondary | ICD-10-CM | POA: Diagnosis not present

## 2013-08-06 DIAGNOSIS — D631 Anemia in chronic kidney disease: Secondary | ICD-10-CM | POA: Diagnosis not present

## 2013-08-06 DIAGNOSIS — N183 Chronic kidney disease, stage 3 unspecified: Secondary | ICD-10-CM | POA: Diagnosis not present

## 2013-08-06 DIAGNOSIS — N039 Chronic nephritic syndrome with unspecified morphologic changes: Secondary | ICD-10-CM | POA: Diagnosis not present

## 2013-08-08 ENCOUNTER — Encounter: Payer: Self-pay | Admitting: Neurology

## 2013-08-08 ENCOUNTER — Ambulatory Visit (INDEPENDENT_AMBULATORY_CARE_PROVIDER_SITE_OTHER): Payer: Medicare Other | Admitting: Neurology

## 2013-08-08 ENCOUNTER — Ambulatory Visit (INDEPENDENT_AMBULATORY_CARE_PROVIDER_SITE_OTHER): Payer: Medicare Other

## 2013-08-08 VITALS — BP 136/73 | HR 58 | Ht 66.5 in | Wt 182.0 lb

## 2013-08-08 DIAGNOSIS — D518 Other vitamin B12 deficiency anemias: Secondary | ICD-10-CM | POA: Diagnosis not present

## 2013-08-08 DIAGNOSIS — G5600 Carpal tunnel syndrome, unspecified upper limb: Secondary | ICD-10-CM

## 2013-08-08 DIAGNOSIS — E1142 Type 2 diabetes mellitus with diabetic polyneuropathy: Secondary | ICD-10-CM

## 2013-08-08 DIAGNOSIS — R269 Unspecified abnormalities of gait and mobility: Secondary | ICD-10-CM | POA: Diagnosis not present

## 2013-08-08 DIAGNOSIS — D513 Other dietary vitamin B12 deficiency anemia: Secondary | ICD-10-CM

## 2013-08-08 HISTORY — DX: Type 2 diabetes mellitus with diabetic polyneuropathy: E11.42

## 2013-08-08 HISTORY — DX: Unspecified abnormalities of gait and mobility: R26.9

## 2013-08-08 MED ORDER — GABAPENTIN 100 MG PO CAPS: ORAL_CAPSULE | ORAL | Status: DC

## 2013-08-08 NOTE — Progress Notes (Signed)
Reason for visit: Gait disturbance  Todd Hall is a 68 y.o. male  History of present illness:  Todd Hall is a 68 year old right-handed white male with a history of diabetes and a diabetic peripheral neuropathy. He has noted some change in his ability to walk within the last 6 months, worse within the last 2 months. He reports some dizziness if he stands up, with some staggering gait during this period time. He was seen through this office in May of 2013 for episodes of syncope. He denies any further episodes of blacking out. He had MRI evaluation of the brain in May of 2013, and this showed minimal small vessel disease. The patient has had poor control of his diabetes until recently, and his hemoglobin A1c has dropped into the 8 range. The patient recently saw his primary care physician, and he was noted to have blood pressure drops with standing going down into the 80 systolic range. The blood pressure medications were reduced, and within the last 2 days, his problems with dizziness have improved. He denies any significant problems controlling the bowels or bladder, but he occasionally will have diarrhea and some problems with urinary frequency. He does have chronic neck pain and some discomfort down into the left shoulder and down the left arm involving the thumb and index finger in the left hand. The patient denies any significant weakness of the extremities. He is hard of hearing, but he denies any problems with memory or cognitive processing. He has no true weakness of the extremities. He comes to this office for further evaluation.  Past Medical History  Diagnosis Date  . Myocardial infarction     h/o 2 small MIs due to his looks of ekg.  . Angina     h/o cp  . Dysrhythmia     h/o being told heart was too large as a child and that it beat too fast  . Pneumonia     pneumonia as a child  . Diabetes mellitus     diagnosed about 15 yrs ago  . Hypothyroidism     takes synthroid -  unsure if problem is hypo or hyper  . Chronic kidney disease     "kidney failure" sees dr. Esau Grew - takes med for this - not on HD  . GERD (gastroesophageal reflux disease)   . Headache(784.0)     h/o headaches and migraines when younger  . Cancer     tongue, throat and neck cancer - treated with chemo and radiation 2004  . Arthritis     arthritis in neck and spine  . Hyperlipidemia   . Hypertension     saw a cardiologist in [place] at least 10 yrs ago.  Had stress test 10 yrs ago.  . Abnormality of gait 08/08/2013  . Polyneuropathy in diabetes(357.2) 08/08/2013  . History of syncope   . HOH (hard of hearing)   . Throat cancer   . Diabetic retinopathy     Past Surgical History  Procedure Laterality Date  . Nasal sinus surgery    . Port-a-cath placement    . Port-a-cath removal  2005  . Anterior cervical decomp/discectomy fusion  12/27/2010    Procedure: ANTERIOR CERVICAL DECOMPRESSION/DISCECTOMY FUSION 1 LEVEL;  Surgeon: [name], MD;  Location: [facility];  Service: Neurosurgery;  Laterality: N/A;  Cervical six-seven anterior cervical decompression fusion with interbody prosthesis and plating  . Laser surgery, retina      Diabetic retinopathy  Family History  Problem Relation Age of Onset  . Diabetes Mother   . Heart Problems Father   . Diabetes Brother   . Diabetes Brother     Social history:  reports that he quit smoking about 32 years ago. His smoking use included Cigarettes. He has a 5 pack-year smoking history. He has never used smokeless tobacco. He reports that he does not drink alcohol or use illicit drugs.  Medications:  Current Outpatient Prescriptions on File Prior to Visit  Medication Sig Dispense Refill  . furosemide (LASIX) 40 MG tablet Take 40 mg by mouth daily.        Marland Kitchen HYDROcodone-acetaminophen (VICODIN) 5-500 MG per tablet Take 1 tablet by mouth every 6 (six) hours as needed. For pain      . insulin lispro protamine-insulin lispro  (HUMALOG 50/50) (50-50) 100 UNIT/ML SUSP Inject 22-24 Units into the skin 2 (two) times daily before a meal. 24 units in the a.m. And 22 units in the p.m.       Marland Kitchen omeprazole (PRILOSEC) 20 MG capsule Take 20 mg by mouth daily.        . rosuvastatin (CRESTOR) 10 MG tablet Take 10 mg by mouth daily.        . sodium bicarbonate 650 MG tablet Take 650 mg by mouth 2 (two) times daily.        Marland Kitchen telmisartan (MICARDIS) 80 MG tablet Take 40 mg by mouth daily.         No current facility-administered medications on file prior to visit.     No Known Allergies  ROS:  Out of a complete 14 system review of symptoms, the patient complains only of the following symptoms, and all other reviewed systems are negative.  Activity change Hearing loss Gait disorder Achy muscles Bruising easily  Blood pressure 136/73, pulse 58, height 5' 6.5" (1.689 m), weight 182 lb (82.555 kg).  Blood pressure, standing, right arm is 162/84.  Physical Exam  General: The patient is alert and cooperative at the time of the examination. The patient is hard of hearing.  Eyes: Pupils are equal, round, and reactive to light. Discs are flat bilaterally.  Neck: The neck is supple, no carotid bruits are noted.  Respiratory: The respiratory examination is clear.  Cardiovascular: The cardiovascular examination reveals a regular rate and rhythm, no obvious murmurs or rubs are noted.  Skin: Extremities are without significant edema.  Neurologic Exam  Mental status: The patient is alert and oriented x 3 at the time of the examination. The patient has apparent normal recent and remote memory, with an apparently normal attention span and concentration ability.  Cranial nerves: Facial symmetry is present. There is good sensation of the face to pinprick and soft touch bilaterally. The strength of the facial muscles and the muscles to head turning and shoulder shrug are normal bilaterally. Speech is well enunciated, no aphasia or  dysarthria is noted. Extraocular movements are full. Visual fields are full. The tongue is midline, and the patient has symmetric elevation of the soft palate. No obvious hearing deficits are noted.  Motor: The motor testing reveals 5 over 5 strength of all 4 extremities. Good symmetric motor tone is noted throughout.  Sensory: Sensory testing is intact to pinprick, soft touch, vibration sensation, and position sense on the upper extremities. With the lower extremities, there is a stocking pattern pinprick sensory deficit up to the knees bilaterally. Vibration sensation is mildly impaired in both feet, position sense appeared to be  intact in both feet. No evidence of extinction is noted.  Coordination: Cerebellar testing reveals good finger-nose-finger and heel-to-shin bilaterally.  Gait and station: Gait is normal. Tandem gait is minimally unsteady. Romberg is negative. No drift is seen.  Reflexes: Deep tendon reflexes are symmetric, but are depressed bilaterally. Toes are downgoing bilaterally.   MRI brain 07/13/11:  Impression: This MRI scan of the brain  shows mild changes of chronic microvascular ischemia and a remote age left thalamic lacunar infarct.   Assessment/Plan:  1. Diabetes  2. Diabetic peripheral neuropathy  3. Gait disturbance  The patient recently was noted to have some orthostasis with drops of blood pressure into the 40G systolic range. Adjusting the blood pressure medications may have already treated the gait disorder. The patient appears to have evidence of a diabetic peripheral neuropathy, and this could add to some of the gait instability issues. The patient will be set up for nerve conduction studies on all 4 extremities, and he will have some blood work done today. He will followup in 3 or 4 months. If the gait does not improve and continues to progress, further evaluation to include MRI of the cervical spine and brain may be done.  Jill Alexanders MD 08/09/2013  1:10 PM  [facility] [address] [place], [place] [address]  Phone [phone] Fax [phone]

## 2013-08-08 NOTE — Procedures (Signed)
     HISTORY:  Mr. Todd Hall is a 68 year old gentleman with a history of diabetes. He reports some worsening of balance and gait over the last 6 months. He does report some burning and stinging sensations in the feet and numbness and tingling in hands. He is being evaluated for a possible neuropathy as a source of the gait disorder.  NERVE CONDUCTION STUDIES:  Nerve conduction studies were performed on both upper extremities. The distal motor latencies for the median nerves were prolonged bilaterally, normal for the ulnar nerves bilaterally. The motor amplitudes for the median nerves bilaterally and for the left ulnar nerve were normal, slightly low for the right ulnar nerve. The F wave latencies for the median nerves were borderline normal on the right, normal on the left, and normal for the left ulnar nerve. The F wave latency for the right ulnar nerve was prolonged. The nerve conduction velocities for the median nerves bilaterally and for the left ulnar nerve were normal, slowed below the elbow for the right ulnar nerve, normal above the elbow. The sensory latencies for the median nerves were prolonged bilaterally. The left ulnar sensory latency was normal, prolonged for the right ulnar sensory latency.  Nerve conduction studies were performed on both lower extremities. The distal motor latencies and motor amplitudes for the peroneal and posterior tibial nerves were normal bilaterally. There was slight slowing below the knee for the left peroneal nerve, normal above the knee. The nerve conduction velocities for the right peroneal nerve and for the posterior tibial nerves bilaterally were normal. The H reflex latencies were slightly prolonged bilaterally. The peroneal sensory latencies were normal bilaterally.  EMG STUDIES:  EMG evaluation was not performed.  IMPRESSION:  Nerve conduction studies done on all 4 extremities shows evidence of mild bilateral carpal tunnel syndrome. There appears  to be a right tardy ulnar palsy as well. There is no clear evidence of a significant peripheral neuropathy, but a small fiber neuropathy cannot be excluded by this study. Clinical correlation is required.  Jill Alexanders MD 08/08/2013 12:58 PM  [facility] [address] [place], [place] [address]  Phone [phone] Fax [phone]

## 2013-08-08 NOTE — Patient Instructions (Signed)

## 2013-08-12 ENCOUNTER — Telehealth: Payer: Self-pay | Admitting: Neurology

## 2013-08-12 LAB — RPR: RPR: NONREACTIVE

## 2013-08-12 LAB — METHYLMALONIC ACID, SERUM: METHYLMALONIC ACID: 395 nmol/L — AB (ref 0–378)

## 2013-08-12 LAB — COPPER, SERUM: Copper: 95 ug/dL (ref 72–166)

## 2013-08-12 LAB — VITAMIN B12: Vitamin B-12: 228 pg/mL (ref 211–946)

## 2013-08-12 NOTE — Telephone Encounter (Signed)
I called patient. The blood work revealed a low normal vitamin B12 level, but the methylmalonic acid level was elevated. The patient is to go on vitamin B12 supplementation 1000 mcg daily.

## 2013-08-21 DIAGNOSIS — N183 Chronic kidney disease, stage 3 unspecified: Secondary | ICD-10-CM | POA: Diagnosis not present

## 2013-09-05 DIAGNOSIS — E875 Hyperkalemia: Secondary | ICD-10-CM | POA: Diagnosis not present

## 2013-09-05 DIAGNOSIS — I10 Essential (primary) hypertension: Secondary | ICD-10-CM | POA: Diagnosis not present

## 2013-09-10 DIAGNOSIS — N039 Chronic nephritic syndrome with unspecified morphologic changes: Secondary | ICD-10-CM | POA: Diagnosis not present

## 2013-09-10 DIAGNOSIS — N189 Chronic kidney disease, unspecified: Secondary | ICD-10-CM | POA: Diagnosis not present

## 2013-09-10 DIAGNOSIS — D631 Anemia in chronic kidney disease: Secondary | ICD-10-CM | POA: Diagnosis not present

## 2013-09-15 DIAGNOSIS — E119 Type 2 diabetes mellitus without complications: Secondary | ICD-10-CM | POA: Diagnosis not present

## 2013-09-23 DIAGNOSIS — E119 Type 2 diabetes mellitus without complications: Secondary | ICD-10-CM | POA: Diagnosis not present

## 2013-09-23 DIAGNOSIS — I10 Essential (primary) hypertension: Secondary | ICD-10-CM | POA: Diagnosis not present

## 2013-09-23 DIAGNOSIS — G589 Mononeuropathy, unspecified: Secondary | ICD-10-CM | POA: Diagnosis not present

## 2013-09-23 DIAGNOSIS — N19 Unspecified kidney failure: Secondary | ICD-10-CM | POA: Diagnosis not present

## 2013-10-01 DIAGNOSIS — M47812 Spondylosis without myelopathy or radiculopathy, cervical region: Secondary | ICD-10-CM | POA: Diagnosis not present

## 2013-10-01 DIAGNOSIS — G5620 Lesion of ulnar nerve, unspecified upper limb: Secondary | ICD-10-CM | POA: Diagnosis not present

## 2013-10-01 DIAGNOSIS — G5600 Carpal tunnel syndrome, unspecified upper limb: Secondary | ICD-10-CM | POA: Diagnosis not present

## 2013-12-03 ENCOUNTER — Encounter: Payer: Self-pay | Admitting: Neurology

## 2013-12-09 ENCOUNTER — Encounter: Payer: Self-pay | Admitting: Neurology

## 2013-12-15 ENCOUNTER — Ambulatory Visit: Payer: Medicare Other | Admitting: Adult Health

## 2013-12-17 DIAGNOSIS — E789 Disorder of lipoprotein metabolism, unspecified: Secondary | ICD-10-CM | POA: Diagnosis not present

## 2013-12-17 DIAGNOSIS — E118 Type 2 diabetes mellitus with unspecified complications: Secondary | ICD-10-CM | POA: Diagnosis not present

## 2013-12-24 DIAGNOSIS — E118 Type 2 diabetes mellitus with unspecified complications: Secondary | ICD-10-CM | POA: Diagnosis not present

## 2013-12-24 DIAGNOSIS — H9190 Unspecified hearing loss, unspecified ear: Secondary | ICD-10-CM | POA: Diagnosis not present

## 2013-12-24 DIAGNOSIS — E789 Disorder of lipoprotein metabolism, unspecified: Secondary | ICD-10-CM | POA: Diagnosis not present

## 2014-01-20 DIAGNOSIS — N183 Chronic kidney disease, stage 3 (moderate): Secondary | ICD-10-CM | POA: Diagnosis not present

## 2014-01-20 DIAGNOSIS — D631 Anemia in chronic kidney disease: Secondary | ICD-10-CM | POA: Diagnosis not present

## 2014-01-20 DIAGNOSIS — I10 Essential (primary) hypertension: Secondary | ICD-10-CM | POA: Diagnosis not present

## 2014-01-20 DIAGNOSIS — N2581 Secondary hyperparathyroidism of renal origin: Secondary | ICD-10-CM | POA: Diagnosis not present

## 2014-01-20 DIAGNOSIS — N189 Chronic kidney disease, unspecified: Secondary | ICD-10-CM | POA: Diagnosis not present

## 2014-03-18 DIAGNOSIS — E789 Disorder of lipoprotein metabolism, unspecified: Secondary | ICD-10-CM | POA: Diagnosis not present

## 2014-03-18 DIAGNOSIS — E118 Type 2 diabetes mellitus with unspecified complications: Secondary | ICD-10-CM | POA: Diagnosis not present

## 2014-03-27 DIAGNOSIS — E789 Disorder of lipoprotein metabolism, unspecified: Secondary | ICD-10-CM | POA: Diagnosis not present

## 2014-03-27 DIAGNOSIS — G629 Polyneuropathy, unspecified: Secondary | ICD-10-CM | POA: Diagnosis not present

## 2014-03-27 DIAGNOSIS — E118 Type 2 diabetes mellitus with unspecified complications: Secondary | ICD-10-CM | POA: Diagnosis not present

## 2014-03-27 DIAGNOSIS — C029 Malignant neoplasm of tongue, unspecified: Secondary | ICD-10-CM | POA: Diagnosis not present

## 2014-05-20 DIAGNOSIS — H2513 Age-related nuclear cataract, bilateral: Secondary | ICD-10-CM | POA: Diagnosis not present

## 2014-05-20 DIAGNOSIS — H3531 Nonexudative age-related macular degeneration: Secondary | ICD-10-CM | POA: Diagnosis not present

## 2014-05-20 DIAGNOSIS — E11319 Type 2 diabetes mellitus with unspecified diabetic retinopathy without macular edema: Secondary | ICD-10-CM | POA: Diagnosis not present

## 2014-05-25 DIAGNOSIS — H35363 Drusen (degenerative) of macula, bilateral: Secondary | ICD-10-CM | POA: Diagnosis not present

## 2014-05-25 DIAGNOSIS — E11359 Type 2 diabetes mellitus with proliferative diabetic retinopathy without macular edema: Secondary | ICD-10-CM | POA: Diagnosis not present

## 2014-05-25 DIAGNOSIS — H2513 Age-related nuclear cataract, bilateral: Secondary | ICD-10-CM | POA: Diagnosis not present

## 2014-06-23 DIAGNOSIS — H2512 Age-related nuclear cataract, left eye: Secondary | ICD-10-CM | POA: Diagnosis not present

## 2014-06-29 DIAGNOSIS — I129 Hypertensive chronic kidney disease with stage 1 through stage 4 chronic kidney disease, or unspecified chronic kidney disease: Secondary | ICD-10-CM | POA: Diagnosis not present

## 2014-06-29 DIAGNOSIS — N184 Chronic kidney disease, stage 4 (severe): Secondary | ICD-10-CM | POA: Diagnosis not present

## 2014-06-29 DIAGNOSIS — D631 Anemia in chronic kidney disease: Secondary | ICD-10-CM | POA: Diagnosis not present

## 2014-06-29 DIAGNOSIS — E782 Mixed hyperlipidemia: Secondary | ICD-10-CM | POA: Diagnosis not present

## 2014-06-29 DIAGNOSIS — E1129 Type 2 diabetes mellitus with other diabetic kidney complication: Secondary | ICD-10-CM | POA: Diagnosis not present

## 2014-06-29 DIAGNOSIS — N2581 Secondary hyperparathyroidism of renal origin: Secondary | ICD-10-CM | POA: Diagnosis not present

## 2014-07-01 DIAGNOSIS — E118 Type 2 diabetes mellitus with unspecified complications: Secondary | ICD-10-CM | POA: Diagnosis not present

## 2014-07-01 DIAGNOSIS — Z125 Encounter for screening for malignant neoplasm of prostate: Secondary | ICD-10-CM | POA: Diagnosis not present

## 2014-07-01 DIAGNOSIS — E789 Disorder of lipoprotein metabolism, unspecified: Secondary | ICD-10-CM | POA: Diagnosis not present

## 2014-07-01 DIAGNOSIS — E034 Atrophy of thyroid (acquired): Secondary | ICD-10-CM | POA: Diagnosis not present

## 2014-07-08 DIAGNOSIS — E789 Disorder of lipoprotein metabolism, unspecified: Secondary | ICD-10-CM | POA: Diagnosis not present

## 2014-07-08 DIAGNOSIS — E118 Type 2 diabetes mellitus with unspecified complications: Secondary | ICD-10-CM | POA: Diagnosis not present

## 2014-07-08 DIAGNOSIS — E875 Hyperkalemia: Secondary | ICD-10-CM | POA: Diagnosis not present

## 2014-08-19 DIAGNOSIS — H2511 Age-related nuclear cataract, right eye: Secondary | ICD-10-CM | POA: Diagnosis not present

## 2014-08-19 DIAGNOSIS — H25011 Cortical age-related cataract, right eye: Secondary | ICD-10-CM | POA: Diagnosis not present

## 2014-09-01 DIAGNOSIS — H2511 Age-related nuclear cataract, right eye: Secondary | ICD-10-CM | POA: Diagnosis not present

## 2014-09-07 DIAGNOSIS — E11341 Type 2 diabetes mellitus with severe nonproliferative diabetic retinopathy with macular edema: Secondary | ICD-10-CM | POA: Diagnosis not present

## 2014-09-07 DIAGNOSIS — E11359 Type 2 diabetes mellitus with proliferative diabetic retinopathy without macular edema: Secondary | ICD-10-CM | POA: Diagnosis not present

## 2014-09-29 DIAGNOSIS — D631 Anemia in chronic kidney disease: Secondary | ICD-10-CM | POA: Diagnosis not present

## 2014-09-29 DIAGNOSIS — N184 Chronic kidney disease, stage 4 (severe): Secondary | ICD-10-CM | POA: Diagnosis not present

## 2014-09-29 DIAGNOSIS — N2581 Secondary hyperparathyroidism of renal origin: Secondary | ICD-10-CM | POA: Diagnosis not present

## 2014-10-29 ENCOUNTER — Encounter: Payer: Self-pay | Admitting: Endocrinology

## 2014-11-19 DIAGNOSIS — H3563 Retinal hemorrhage, bilateral: Secondary | ICD-10-CM | POA: Diagnosis not present

## 2014-11-19 DIAGNOSIS — E113413 Type 2 diabetes mellitus with severe nonproliferative diabetic retinopathy with macular edema, bilateral: Secondary | ICD-10-CM | POA: Diagnosis not present

## 2014-11-26 DIAGNOSIS — E113592 Type 2 diabetes mellitus with proliferative diabetic retinopathy without macular edema, left eye: Secondary | ICD-10-CM | POA: Diagnosis not present

## 2014-11-26 DIAGNOSIS — H353131 Nonexudative age-related macular degeneration, bilateral, early dry stage: Secondary | ICD-10-CM | POA: Diagnosis not present

## 2014-11-26 DIAGNOSIS — E113591 Type 2 diabetes mellitus with proliferative diabetic retinopathy without macular edema, right eye: Secondary | ICD-10-CM | POA: Diagnosis not present

## 2014-12-03 DIAGNOSIS — E113592 Type 2 diabetes mellitus with proliferative diabetic retinopathy without macular edema, left eye: Secondary | ICD-10-CM | POA: Diagnosis not present

## 2014-12-16 DIAGNOSIS — D631 Anemia in chronic kidney disease: Secondary | ICD-10-CM | POA: Diagnosis not present

## 2014-12-16 DIAGNOSIS — N189 Chronic kidney disease, unspecified: Secondary | ICD-10-CM | POA: Diagnosis not present

## 2014-12-23 DIAGNOSIS — N189 Chronic kidney disease, unspecified: Secondary | ICD-10-CM | POA: Diagnosis not present

## 2014-12-23 DIAGNOSIS — D631 Anemia in chronic kidney disease: Secondary | ICD-10-CM | POA: Diagnosis not present

## 2014-12-23 DIAGNOSIS — D472 Monoclonal gammopathy: Secondary | ICD-10-CM | POA: Diagnosis not present

## 2015-02-16 DIAGNOSIS — I129 Hypertensive chronic kidney disease with stage 1 through stage 4 chronic kidney disease, or unspecified chronic kidney disease: Secondary | ICD-10-CM | POA: Diagnosis not present

## 2015-02-16 DIAGNOSIS — E1129 Type 2 diabetes mellitus with other diabetic kidney complication: Secondary | ICD-10-CM | POA: Diagnosis not present

## 2015-02-16 DIAGNOSIS — N184 Chronic kidney disease, stage 4 (severe): Secondary | ICD-10-CM | POA: Diagnosis not present

## 2015-02-16 DIAGNOSIS — E11319 Type 2 diabetes mellitus with unspecified diabetic retinopathy without macular edema: Secondary | ICD-10-CM | POA: Diagnosis not present

## 2015-02-16 DIAGNOSIS — N2581 Secondary hyperparathyroidism of renal origin: Secondary | ICD-10-CM | POA: Diagnosis not present

## 2015-02-16 DIAGNOSIS — Z23 Encounter for immunization: Secondary | ICD-10-CM | POA: Diagnosis not present

## 2015-02-16 DIAGNOSIS — E039 Hypothyroidism, unspecified: Secondary | ICD-10-CM | POA: Diagnosis not present

## 2015-02-16 DIAGNOSIS — E118 Type 2 diabetes mellitus with unspecified complications: Secondary | ICD-10-CM | POA: Diagnosis not present

## 2015-02-16 DIAGNOSIS — E782 Mixed hyperlipidemia: Secondary | ICD-10-CM | POA: Diagnosis not present

## 2015-02-16 DIAGNOSIS — N2589 Other disorders resulting from impaired renal tubular function: Secondary | ICD-10-CM | POA: Diagnosis not present

## 2015-02-16 DIAGNOSIS — G629 Polyneuropathy, unspecified: Secondary | ICD-10-CM | POA: Diagnosis not present

## 2015-02-16 DIAGNOSIS — D631 Anemia in chronic kidney disease: Secondary | ICD-10-CM | POA: Diagnosis not present

## 2015-06-09 DIAGNOSIS — E113591 Type 2 diabetes mellitus with proliferative diabetic retinopathy without macular edema, right eye: Secondary | ICD-10-CM | POA: Diagnosis not present

## 2015-06-09 DIAGNOSIS — H35363 Drusen (degenerative) of macula, bilateral: Secondary | ICD-10-CM | POA: Diagnosis not present

## 2015-06-09 DIAGNOSIS — H3563 Retinal hemorrhage, bilateral: Secondary | ICD-10-CM | POA: Diagnosis not present

## 2015-06-09 DIAGNOSIS — E113592 Type 2 diabetes mellitus with proliferative diabetic retinopathy without macular edema, left eye: Secondary | ICD-10-CM | POA: Diagnosis not present

## 2015-06-09 DIAGNOSIS — H353131 Nonexudative age-related macular degeneration, bilateral, early dry stage: Secondary | ICD-10-CM | POA: Diagnosis not present

## 2015-06-21 DIAGNOSIS — D631 Anemia in chronic kidney disease: Secondary | ICD-10-CM | POA: Diagnosis not present

## 2015-06-21 DIAGNOSIS — N189 Chronic kidney disease, unspecified: Secondary | ICD-10-CM | POA: Diagnosis not present

## 2015-06-29 DIAGNOSIS — E875 Hyperkalemia: Secondary | ICD-10-CM | POA: Diagnosis not present

## 2015-07-09 DIAGNOSIS — G629 Polyneuropathy, unspecified: Secondary | ICD-10-CM | POA: Diagnosis not present

## 2015-07-09 DIAGNOSIS — E782 Mixed hyperlipidemia: Secondary | ICD-10-CM | POA: Diagnosis not present

## 2015-07-09 DIAGNOSIS — D631 Anemia in chronic kidney disease: Secondary | ICD-10-CM | POA: Diagnosis not present

## 2015-07-09 DIAGNOSIS — I129 Hypertensive chronic kidney disease with stage 1 through stage 4 chronic kidney disease, or unspecified chronic kidney disease: Secondary | ICD-10-CM | POA: Diagnosis not present

## 2015-07-09 DIAGNOSIS — N2581 Secondary hyperparathyroidism of renal origin: Secondary | ICD-10-CM | POA: Diagnosis not present

## 2015-07-09 DIAGNOSIS — E118 Type 2 diabetes mellitus with unspecified complications: Secondary | ICD-10-CM | POA: Diagnosis not present

## 2015-07-09 DIAGNOSIS — N184 Chronic kidney disease, stage 4 (severe): Secondary | ICD-10-CM | POA: Diagnosis not present

## 2015-07-09 DIAGNOSIS — E039 Hypothyroidism, unspecified: Secondary | ICD-10-CM | POA: Diagnosis not present

## 2015-07-09 DIAGNOSIS — E1129 Type 2 diabetes mellitus with other diabetic kidney complication: Secondary | ICD-10-CM | POA: Diagnosis not present

## 2015-07-09 DIAGNOSIS — E11319 Type 2 diabetes mellitus with unspecified diabetic retinopathy without macular edema: Secondary | ICD-10-CM | POA: Diagnosis not present

## 2015-07-09 DIAGNOSIS — N2589 Other disorders resulting from impaired renal tubular function: Secondary | ICD-10-CM | POA: Diagnosis not present

## 2015-07-09 DIAGNOSIS — H9193 Unspecified hearing loss, bilateral: Secondary | ICD-10-CM | POA: Diagnosis not present

## 2015-08-13 ENCOUNTER — Ambulatory Visit: Payer: Medicare Other | Admitting: Cardiovascular Disease

## 2015-09-13 ENCOUNTER — Encounter: Payer: Self-pay | Admitting: Cardiovascular Disease

## 2015-09-13 ENCOUNTER — Ambulatory Visit (INDEPENDENT_AMBULATORY_CARE_PROVIDER_SITE_OTHER): Payer: Medicare Other | Admitting: Cardiovascular Disease

## 2015-09-13 ENCOUNTER — Encounter (INDEPENDENT_AMBULATORY_CARE_PROVIDER_SITE_OTHER): Payer: Self-pay

## 2015-09-13 VITALS — BP 161/95 | HR 81 | Ht 67.5 in | Wt 166.2 lb

## 2015-09-13 DIAGNOSIS — R0602 Shortness of breath: Secondary | ICD-10-CM | POA: Diagnosis not present

## 2015-09-13 DIAGNOSIS — R55 Syncope and collapse: Secondary | ICD-10-CM

## 2015-09-13 DIAGNOSIS — R0789 Other chest pain: Secondary | ICD-10-CM

## 2015-09-13 DIAGNOSIS — E119 Type 2 diabetes mellitus without complications: Secondary | ICD-10-CM

## 2015-09-13 DIAGNOSIS — W19XXXA Unspecified fall, initial encounter: Secondary | ICD-10-CM | POA: Diagnosis not present

## 2015-09-13 DIAGNOSIS — R072 Precordial pain: Secondary | ICD-10-CM

## 2015-09-13 NOTE — Progress Notes (Signed)
Cardiology Office Note   Date:  09/14/2015   ID:  Todd Hall, DOB [birth year], MRN [id number]  PCP:  Dwan Bolt, MD  Cardiologist:   Skeet Latch, MD   Chief Complaint  Patient presents with  . New Patient (Initial Visit)    sob; frequently. chest tighting. lightheaded; frequently. cramping and pain in legs.      History of Present Illness: Todd Hall is a 70 y.o. male with diabetes, CAD status post MI, hypertension, hyperlipidemia, CKD 3-4, and hypothyroidism, who presents for an evaluation of dyspnea on exertion.  He reports shortness of breath with minimal exertion for over one year. He notes that it is most severe when walking up or down stairs. He also has to stop while mowing his lawn. It takes him several days to mow the lawn while his son could mow it in approximately one hour. He sometimes notes chest tightness associated with this shortness of breath. He denies diaphoresis or nausea. There are no associated palpitations. However last week he had 3 episodes of falling. It is unclear whether he had syncope at these events. They occurred after standing from a seated position.  No palpitations but he felt lightheaded and the next thing he knew he was on the ground. He is unsure whether he lost consciousness but he did not hit his head.  Todd Hall denies lower extremity edema, orthopnea, or PND. His wife notes that he does snore loudly but denies apneic episodes.   Past Medical History:  Diagnosis Date  . Abnormality of gait 08/08/2013  . Angina    h/o cp  . Arthritis    arthritis in neck and spine  . Cancer ([place])    tongue, throat and neck cancer - treated with chemo and radiation 2004  . Chest pain 09/14/2015  . Chronic kidney disease    "kidney failure" sees dr. Esau Grew - takes med for this - not on HD  . Diabetes mellitus    diagnosed about 15 yrs ago  . Diabetes mellitus type 2 in nonobese ([place]) 09/14/2015  . Diabetic retinopathy ([place])   .  Dysrhythmia    h/o being told heart was too large as a child and that it beat too fast  . Falls 09/14/2015  . GERD (gastroesophageal reflux disease)   . Headache(784.0)    h/o headaches and migraines when younger  . History of syncope   . HOH (hard of hearing)   . Hyperlipidemia   . Hypertension    saw a cardiologist in  at least 10 yrs ago.  Had stress test 10 yrs ago.  Marland Kitchen Hypothyroidism    takes synthroid - unsure if problem is hypo or hyper  . Myocardial infarction [facility])    h/o 2 small MIs due to his looks of ekg.  . Pneumonia    pneumonia as a child  . Polyneuropathy in diabetes(357.2) 08/08/2013  . Shortness of breath 09/14/2015  . Syncope 09/14/2015  . Throat cancer [facility])     Past Surgical History:  Procedure Laterality Date  . ANTERIOR CERVICAL DECOMP/DISCECTOMY FUSION  12/27/2010   Procedure: ANTERIOR CERVICAL DECOMPRESSION/DISCECTOMY FUSION 1 LEVEL;  Surgeon: [name], MD;  Location: [place] [facility];  Service: Neurosurgery;  Laterality: N/A;  Cervical six-seven anterior cervical decompression fusion with interbody prosthesis and plating  . Laser surgery, retina     Diabetic retinopathy  . NASAL SINUS SURGERY    . port-a-cath placement    . PORT-A-CATH REMOVAL  2005     Current Outpatient Prescriptions  Medication Sig Dispense Refill  . calcitRIOL (ROCALTROL) 0.25 MCG capsule Take 1 capsule by mouth daily.    . furosemide (LASIX) 40 MG tablet Take 40 mg by mouth daily.      Marland Kitchen gabapentin (NEURONTIN) 100 MG capsule One capsule in the morning and two capsules at night    . HYDROcodone-acetaminophen (VICODIN) 5-500 MG per tablet Take 1 tablet by mouth every 6 (six) hours as needed. For pain    . insulin lispro protamine-insulin lispro (HUMALOG 50/50) (50-50) 100 UNIT/ML SUSP Inject 22-24 Units into the skin 2 (two) times daily before a meal. 24 units in the a.m. And 22 units in the p.m.     Marland Kitchen omeprazole (PRILOSEC) 20 MG capsule Take 20 mg by mouth daily.      .  sodium bicarbonate 650 MG tablet Take 650 mg by mouth 2 (two) times daily.      . vitamin B-12 (CYANOCOBALAMIN) 1000 MCG tablet Take 1,000 mcg by mouth daily.     No current facility-administered medications for this visit.     Allergies:   Review of patient's allergies indicates no known allergies.    Social History:  The patient  reports that he quit smoking about 34 years ago. His smoking use included Cigarettes. He has a 5.00 pack-year smoking history. He has never used smokeless tobacco. He reports that he does not drink alcohol or use drugs.   Family History:  The patient's family history includes CAD in his father; Diabetes in his brother, brother, and mother; Heart Problems in his brother, brother, and father; Peripheral Artery Disease in his brother and brother.    ROS:  Please see the history of present illness.   Otherwise, review of systems are positive for none.   All other systems are reviewed and negative.    PHYSICAL EXAM: VS:  BP (!) 161/95   Pulse 81   Ht 5' 7.5" (1.715 m)   Wt 166 lb 3.2 oz (75.4 kg)   BMI 25.65 kg/m  , BMI Body mass index is 25.65 kg/m. GENERAL:  Well appearing HEENT:  Pupils equal round and reactive, fundi not visualized, oral mucosa unremarkable NECK:  No jugular venous distention, waveform within normal limits, carotid upstroke brisk and symmetric, no bruits, no thyromegaly LYMPHATICS:  No cervical adenopathy LUNGS:  Clear to auscultation bilaterally HEART:  RRR.  PMI not displaced or sustained,S1 and S2 within normal limits, no S3, no S4, no clicks, no rubs, no murmurs ABD:  Flat, positive bowel sounds normal in frequency in pitch, no bruits, no rebound, no guarding, no midline pulsatile mass, no hepatomegaly, no splenomegaly EXT:  2 plus pulses throughout, no edema, no cyanosis no clubbing SKIN:  No rashes no nodules NEURO:  Cranial nerves II through XII grossly intact, motor grossly intact throughout PSYCH:  Cognitively intact, oriented to  person place and time   EKG:  EKG is ordered today. The ekg ordered today demonstrates sinus rhythm. Rate 81 bpm.   Recent Labs: No results found for requested labs within last 8760 hours.   07/09/15: sodium 137, potassium 4.7, BUN 27, creatinine 1.76 AST 8, ALT 6 WBC 6.1, hemoglobin 12.4, hematocrit 36.7, platelets 151  Lipid Panel No results found for: CHOL, TRIG, HDL, CHOLHDL, VLDL, LDLCALC, LDLDIRECT    Wt Readings from Last 3 Encounters:  09/13/15 166 lb 3.2 oz (75.4 kg)  08/08/13 182 lb (82.6 kg)  12/21/10 167 lb 5.3 oz (75.9  kg)      ASSESSMENT AND PLAN:  # Shortness of breath: # Chest tightness: Todd Hall has over one year of exertional dyspnea that seems to be getting worse. He also has mild chest tightness. He has a long-standing history of diabetes and a strong family history of coronary artery disease. Given his recent falls, we will not put him on a treadmill to evaluate. He will obtain a Lexiscan Myoview to evaluate for ischemia.  We will also obtain an echo to look for heart failure or valvular heart disease.  Todd Hall has a history of CAD and it is unclear why he is not on an aspirin.  Also, he has a history of diabetes and is not on a statin.  We will start aspirin 81 mg daily and ask that he get his lipids checked when he returns for his stress test.  # Elevated blood pressure:  Todd Hall' blood pressure was elevated today but is typically well-controlled. His blood pressure was 130/66 at his nephrologist office. He was orthostatic in the office today and has experienced episodes of syncope/falls.  Therefore, we will continue to monitor for now.  # Falls: It is unclear whether he had syncope or fall. Given that it occurred after standing from a seated position orthostatic syncope is suspected. He was orthostatic in the office today.  We discussed the importance of hydration and increasing salt intake.  We will also check an echo as above.   Current medicines  are reviewed at length with the patient today.  The patient does not have concerns regarding medicines.  The following changes have been made:  no change  Labs/ tests ordered today include:   Orders Placed This Encounter  Procedures  . Myocardial Perfusion Imaging  . EKG 12-Lead  . ECHOCARDIOGRAM COMPLETE     Disposition:   FU with Dorethea Strubel C. Oval Linsey, MD, [facility] in 1 month    This note was written with the assistance of speech recognition software.  Please excuse any transcriptional errors.  Signed, Shakeel Disney C. Oval Linsey, MD, [facility]  09/14/2015 3:13 PM    Todd Hall

## 2015-09-13 NOTE — Patient Instructions (Signed)
Medication Instructions:  Your physician recommends that you continue on your current medications as directed. Please refer to the Current Medication list given to you today.  Labwork: none  Testing/Procedures: Your physician has requested that you have an echocardiogram. Echocardiography is a painless test that uses sound waves to create images of your heart. It provides your doctor with information about the size and shape of your heart and how well your heart's chambers and valves are working. This procedure takes approximately one hour. There are no restrictions for this procedure. [facility] AT [address]  Your physician has requested that you have a lexiscan myoview. For further information please visit [email or web address]. Please follow instruction sheet, as given.  Follow-Up: Your physician recommends that you schedule a follow-up appointment in: 1 month ov  If you need a refill on your cardiac medications before your next appointment, please call your pharmacy.   

## 2015-09-14 ENCOUNTER — Encounter: Payer: Self-pay | Admitting: Cardiovascular Disease

## 2015-09-14 ENCOUNTER — Telehealth: Payer: Self-pay | Admitting: *Deleted

## 2015-09-14 DIAGNOSIS — E119 Type 2 diabetes mellitus without complications: Secondary | ICD-10-CM

## 2015-09-14 DIAGNOSIS — R079 Chest pain, unspecified: Secondary | ICD-10-CM | POA: Insufficient documentation

## 2015-09-14 DIAGNOSIS — W19XXXA Unspecified fall, initial encounter: Secondary | ICD-10-CM

## 2015-09-14 DIAGNOSIS — R55 Syncope and collapse: Secondary | ICD-10-CM

## 2015-09-14 DIAGNOSIS — R0602 Shortness of breath: Secondary | ICD-10-CM

## 2015-09-14 DIAGNOSIS — Z13220 Encounter for screening for lipoid disorders: Secondary | ICD-10-CM

## 2015-09-14 DIAGNOSIS — R296 Repeated falls: Secondary | ICD-10-CM

## 2015-09-14 HISTORY — DX: Shortness of breath: R06.02

## 2015-09-14 HISTORY — DX: Type 2 diabetes mellitus without complications: E11.9

## 2015-09-14 HISTORY — DX: Unspecified fall, initial encounter: W19.XXXA

## 2015-09-14 HISTORY — DX: Repeated falls: R29.6

## 2015-09-14 HISTORY — DX: Chest pain, unspecified: R07.9

## 2015-09-14 HISTORY — DX: Syncope and collapse: R55

## 2015-09-14 NOTE — Telephone Encounter (Signed)
Advised wife who was with patient at appointment yesterday, patient very hard of hearing

## 2015-09-14 NOTE — Telephone Encounter (Signed)
-----   Message from Skeet Latch, MD sent at 09/14/2015  3:13 PM EDT ----- Regarding: please call Please call Mr. Helle and advise him to take aspirin 81 mg daily.  Also, please ask him to have fasting lipids checked when he returns for his stress test.  Thanks, Tiffany

## 2015-09-17 ENCOUNTER — Telehealth (HOSPITAL_COMMUNITY): Payer: Self-pay

## 2015-09-17 NOTE — Telephone Encounter (Signed)
I was unable to view the original DPR document. I did give wife detailed instructions for pt to follow for NUC MPI on Wednesday. I did encourage wife to have pt fill out new DPR on Wednesday. Encounter complete.

## 2015-09-22 ENCOUNTER — Ambulatory Visit (HOSPITAL_COMMUNITY)
Admission: RE | Admit: 2015-09-22 | Discharge: 2015-09-22 | Disposition: A | Discharge status: Home / Self Care | Payer: Medicare Other | Source: Ambulatory Visit | Attending: Cardiovascular Disease | Admitting: Cardiovascular Disease

## 2015-09-22 DIAGNOSIS — R5383 Other fatigue: Secondary | ICD-10-CM | POA: Insufficient documentation

## 2015-09-22 DIAGNOSIS — R0602 Shortness of breath: Secondary | ICD-10-CM

## 2015-09-22 DIAGNOSIS — E039 Hypothyroidism, unspecified: Secondary | ICD-10-CM | POA: Insufficient documentation

## 2015-09-22 DIAGNOSIS — E1122 Type 2 diabetes mellitus with diabetic chronic kidney disease: Secondary | ICD-10-CM | POA: Insufficient documentation

## 2015-09-22 DIAGNOSIS — I129 Hypertensive chronic kidney disease with stage 1 through stage 4 chronic kidney disease, or unspecified chronic kidney disease: Secondary | ICD-10-CM | POA: Insufficient documentation

## 2015-09-22 DIAGNOSIS — Z8249 Family history of ischemic heart disease and other diseases of the circulatory system: Secondary | ICD-10-CM | POA: Insufficient documentation

## 2015-09-22 DIAGNOSIS — N183 Chronic kidney disease, stage 3 (moderate): Secondary | ICD-10-CM | POA: Diagnosis not present

## 2015-09-22 DIAGNOSIS — I2510 Atherosclerotic heart disease of native coronary artery without angina pectoris: Secondary | ICD-10-CM | POA: Diagnosis not present

## 2015-09-22 DIAGNOSIS — Z87891 Personal history of nicotine dependence: Secondary | ICD-10-CM | POA: Diagnosis not present

## 2015-09-22 DIAGNOSIS — R0789 Other chest pain: Secondary | ICD-10-CM | POA: Insufficient documentation

## 2015-09-22 DIAGNOSIS — R0609 Other forms of dyspnea: Secondary | ICD-10-CM | POA: Diagnosis not present

## 2015-09-22 LAB — MYOCARDIAL PERFUSION IMAGING
CHL CUP NUCLEAR SDS: 2
CHL CUP NUCLEAR SRS: 3
CHL CUP NUCLEAR SSS: 5
CSEPPHR: 88 {beats}/min
LV dias vol: 45 mL (ref 62–150)
LV sys vol: 101 mL
Rest HR: 63 {beats}/min
TID: 1.17

## 2015-09-22 MED ORDER — REGADENOSON 0.4 MG/5ML IV SOLN
0.4000 mg | Freq: Once | INTRAVENOUS | Status: AC
  Administered 2015-09-22: 0.4 mg via INTRAVENOUS

## 2015-09-22 MED ORDER — TECHNETIUM TC 99M TETROFOSMIN IV KIT
10.9000 | PACK | Freq: Once | INTRAVENOUS | Status: AC | PRN
  Administered 2015-09-22: 10.9 via INTRAVENOUS
  Filled 2015-09-22: qty 11

## 2015-09-22 MED ORDER — TECHNETIUM TC 99M TETROFOSMIN IV KIT
32.9000 | PACK | Freq: Once | INTRAVENOUS | Status: AC | PRN
  Administered 2015-09-22: 32.9 via INTRAVENOUS
  Filled 2015-09-22: qty 33

## 2015-09-30 ENCOUNTER — Other Ambulatory Visit: Payer: Self-pay

## 2015-09-30 ENCOUNTER — Ambulatory Visit (HOSPITAL_COMMUNITY): Payer: Medicare Other | Attending: Cardiology

## 2015-09-30 DIAGNOSIS — R0602 Shortness of breath: Secondary | ICD-10-CM | POA: Diagnosis not present

## 2015-09-30 DIAGNOSIS — R0789 Other chest pain: Secondary | ICD-10-CM | POA: Diagnosis not present

## 2015-10-05 DIAGNOSIS — E118 Type 2 diabetes mellitus with unspecified complications: Secondary | ICD-10-CM | POA: Diagnosis not present

## 2015-10-05 DIAGNOSIS — E032 Hypothyroidism due to medicaments and other exogenous substances: Secondary | ICD-10-CM | POA: Diagnosis not present

## 2015-10-05 DIAGNOSIS — I10 Essential (primary) hypertension: Secondary | ICD-10-CM | POA: Diagnosis not present

## 2015-10-05 DIAGNOSIS — E789 Disorder of lipoprotein metabolism, unspecified: Secondary | ICD-10-CM | POA: Diagnosis not present

## 2015-10-07 ENCOUNTER — Other Ambulatory Visit: Payer: Self-pay | Admitting: Endocrinology

## 2015-10-07 DIAGNOSIS — R29898 Other symptoms and signs involving the musculoskeletal system: Secondary | ICD-10-CM

## 2015-10-07 DIAGNOSIS — W19XXXA Unspecified fall, initial encounter: Secondary | ICD-10-CM

## 2015-10-15 ENCOUNTER — Ambulatory Visit (INDEPENDENT_AMBULATORY_CARE_PROVIDER_SITE_OTHER): Payer: Medicare Other | Admitting: Cardiovascular Disease

## 2015-10-15 VITALS — BP 135/80 | HR 78 | Ht 67.0 in | Wt 167.2 lb

## 2015-10-15 DIAGNOSIS — I10 Essential (primary) hypertension: Secondary | ICD-10-CM | POA: Diagnosis not present

## 2015-10-15 DIAGNOSIS — I77810 Thoracic aortic ectasia: Secondary | ICD-10-CM

## 2015-10-15 DIAGNOSIS — I7121 Aneurysm of the ascending aorta, without rupture: Secondary | ICD-10-CM

## 2015-10-15 DIAGNOSIS — R0602 Shortness of breath: Secondary | ICD-10-CM

## 2015-10-15 DIAGNOSIS — I712 Thoracic aortic aneurysm, without rupture: Secondary | ICD-10-CM

## 2015-10-15 NOTE — Patient Instructions (Signed)
Medication Instructions:  Your physician recommends that you continue on your current medications as directed. Please refer to the Current Medication list given to you today.  Labwork: none  Testing/Procedures: Your physician has recommended that you have a pulmonary function test. Pulmonary Function Tests are a group of tests that measure how well air moves in and out of your lungs.  Your physician has requested that you have an echocardiogram. Echocardiography is a painless test that uses sound waves to create images of your heart. It provides your doctor with information about the size and shape of your heart and how well your heart's chambers and valves are working. This procedure takes approximately one hour. There are no restrictions for this procedure. 1 year  Follow-Up: Your physician wants you to follow-up in: 1 year a few days after echo You will receive a reminder letter in the mail two months in advance. If you don't receive a letter, please call our office to schedule the follow-up appointment.  If you need a refill on your cardiac medications before your next appointment, please call your pharmacy.

## 2015-10-15 NOTE — Progress Notes (Signed)
Cardiology Office Note   Date:  10/20/2015   ID:  Todd Hall, DOB 08/29/1945, MRN [id number]  PCP:  Dwan Bolt, MD  Cardiologist:   Skeet Latch, MD   Chief Complaint  Patient presents with  . Follow-up    pt denied chest pain and SOB      History of Present Illness: Todd Hall is a 70 y.o. male with diabetes, CAD status post MI, hypertension, hyperlipidemia, CKD 3-4, and hypothyroidism, who presents for follow up.  Todd Hall was seen 08/2015 with a complaint of dyspnea on exertion and falls that were possibly related to syncope.  Since his last appointment Todd Hall had an echo that revealed LVEF 55-60% with grade 1 diastolic dysfunction.  He had orthostatic vital signs checked and was noted to be orthostatic.  He was encouraged to increase his fluid and salt intake.  There is mild dilation of the ascending aorta at 3.8 cm. He also had a Lexiscan Myoview on 09/22/15 that was negative for ischemia.  He continues to feel tired and stumbles.  He denies any falls since his last appointment.  He reports exertional shortness of breath that is unchanged.  He has a head CT and neurology appointment pending.  He denies chest pain, lower extremity edema, orthopnea or PND.  Past Medical History:  Diagnosis Date  . Abnormality of gait 08/08/2013  . Angina    h/o cp  . Arthritis    arthritis in neck and spine  . Cancer ([place])    tongue, throat and neck cancer - treated with chemo and radiation 2004  . Chest pain 09/14/2015  . Chronic kidney disease    "kidney failure" sees dr. Esau Grew - takes med for this - not on HD  . Diabetes mellitus    diagnosed about 15 yrs ago  . Diabetes mellitus type 2 in nonobese (Todd Hall) 09/14/2015  . Diabetic retinopathy ([place])   . Dysrhythmia    h/o being told heart was too large as a child and that it beat too fast  . Falls 09/14/2015  . GERD (gastroesophageal reflux disease)   . Headache(784.0)    h/o headaches and migraines when younger    . History of syncope   . HOH (hard of hearing)   . Hyperlipidemia   . Hypertension    saw a cardiologist in [place] at least 10 yrs ago.  Had stress test 10 yrs ago.  Marland Kitchen Hypothyroidism    takes synthroid - unsure if problem is hypo or hyper  . Myocardial infarction    h/o 2 small MIs due to his looks of ekg.  . Pneumonia    pneumonia as a child  . Polyneuropathy in diabetes(357.2) 08/08/2013  . Shortness of breath 09/14/2015  . Syncope 09/14/2015  . Throat cancer [facility])     Past Surgical History:  Procedure Laterality Date  . ANTERIOR CERVICAL DECOMP/DISCECTOMY FUSION  12/27/2010   Procedure: ANTERIOR CERVICAL DECOMPRESSION/DISCECTOMY FUSION 1 LEVEL;  Surgeon: [name], MD;  Location: [place] [facility];  Service: Neurosurgery;  Laterality: N/A;  Cervical six-seven anterior cervical decompression fusion with interbody prosthesis and plating  . Laser surgery, retina     Diabetic retinopathy  . NASAL SINUS SURGERY    . port-a-cath placement    . PORT-A-CATH REMOVAL  2005     Current Outpatient Prescriptions  Medication Sig Dispense Refill  . aspirin EC 81 MG tablet Take 81 mg by mouth daily.    . calcitRIOL (ROCALTROL)  0.25 MCG capsule Take 1 capsule by mouth daily.    . furosemide (LASIX) 40 MG tablet Take 40 mg by mouth daily.      Marland Kitchen gabapentin (NEURONTIN) 400 MG capsule Take 1 capsule by mouth 2 (two) times daily.    Marland Kitchen HYDROcodone-acetaminophen (VICODIN) 5-500 MG per tablet Take 1 tablet by mouth every 6 (six) hours as needed. For pain    . insulin lispro protamine-insulin lispro (HUMALOG 50/50) (50-50) 100 UNIT/ML SUSP Inject 22-24 Units into the skin 2 (two) times daily before a meal. 24 units in the a.m. And 22 units in the p.m.     Marland Kitchen omeprazole (PRILOSEC) 20 MG capsule Take 20 mg by mouth daily.      . sodium bicarbonate 650 MG tablet Take 650 mg by mouth 2 (two) times daily.      . vitamin B-12 (CYANOCOBALAMIN) 1000 MCG tablet Take 1,000 mcg by mouth daily.     No current  facility-administered medications for this visit.     Allergies:   Review of patient's allergies indicates no known allergies.    Social History:  The patient  reports that he quit smoking about 34 years ago. His smoking use included Cigarettes. He has a 5.00 pack-year smoking history. He has never used smokeless tobacco. He reports that he does not drink alcohol or use drugs.   Family History:  The patient's family history includes CAD in his father; Diabetes in his brother, brother, and mother; Heart Problems in his brother, brother, and father; Peripheral Artery Disease in his brother and brother.    ROS:  Please see the history of present illness.   Otherwise, review of systems are positive for none.   All other systems are reviewed and negative.    PHYSICAL EXAM: VS:  BP 135/80   Pulse 78   Ht 5\' 7"  (1.702 m)   Wt 167 lb 3.2 oz (75.8 kg)   BMI 26.19 kg/m  , BMI Body mass index is 26.19 kg/m. GENERAL:  Well appearing HEENT:  Pupils equal round and reactive, fundi not visualized, oral mucosa unremarkable NECK:  No jugular venous distention, waveform within normal limits, carotid upstroke brisk and symmetric, no bruits, no thyromegaly LYMPHATICS:  No cervical adenopathy LUNGS:  Clear to auscultation bilaterally HEART:  RRR.  PMI not displaced or sustained,S1 and S2 within normal limits, no S3, no S4, no clicks, no rubs, no murmurs ABD:  Flat, positive bowel sounds normal in frequency in pitch, no bruits, no rebound, no guarding, no midline pulsatile mass, no hepatomegaly, no splenomegaly EXT:  2 plus pulses throughout, no edema, no cyanosis no clubbing SKIN:  No rashes no nodules NEURO:  Cranial nerves II through XII grossly intact, motor grossly intact throughout PSYCH:  Cognitively intact, oriented to person place and time   EKG:  EKG is ordered today. The ekg ordered today demonstrates sinus rhythm. Rate 81 bpm.  Lexiscan Myoview 09/22/15:  Nuclear stress EF: 56%.  The  left ventricular ejection fraction is normal (55-65%).  The study is normal.  This is a low risk study.   Echo 09/30/15: Study Conclusions  - Left ventricle: The cavity size was normal. Wall thickness was   increased in a pattern of mild LVH. Systolic function was normal.   The estimated ejection fraction was in the range of 55% to 60%.   Wall motion was normal; there were no regional wall motion   abnormalities. Doppler parameters are consistent with abnormal   left ventricular  relaxation (grade 1 diastolic dysfunction). - Aortic valve: There was no stenosis. - Aorta: Mildly dilated aortic root. Aortic root dimension: 38 mm   (ED). - Mitral valve: Mildly calcified annulus. There was no significant   regurgitation. - Right ventricle: The cavity size was normal. Systolic function   was normal. - Pulmonary arteries: No complete TR doppler jet so unable to   estimate PA systolic pressure. - Inferior vena cava: The vessel was normal in size. The   respirophasic diameter changes were in the normal range (>= 50%),   consistent with normal central venous pressure.  Impressions:  - Normal LV size with mild LV hypertrophy. EF 55-60%. Normal RV   size and systolic function. No significant valvular   abnormalities.   Recent Labs: No results found for requested labs within last 8760 hours.   07/09/15:  Sodium 137, potassium 4.7, BUN 27, creatinine 1.76 AST 8, ALT 6 WBC 6.1, hemoglobin 12.4, hematocrit 36.7, platelets 151  Lipid Panel No results found for: CHOL, TRIG, HDL, CHOLHDL, VLDL, LDLCALC, LDLDIRECT    Wt Readings from Last 3 Encounters:  10/15/15 167 lb 3.2 oz (75.8 kg)  09/22/15 166 lb (75.3 kg)  09/13/15 166 lb 3.2 oz (75.4 kg)      ASSESSMENT AND PLAN:  # Shortness of breath: # Chest tightness: Mr. Point has over one year of exertional dyspnea.  Echo and Lexiscan Myoview were unremarkable.  We will obtain PFTs.  If abnormal we will refer him to  pulmonary.  # Mild ascending aorta aneurysm: 3.8cm 09/2015.  Repeat echo in 1 year.  # Elevated blood pressure: Blood pressure is well-controlled today.  He has problems with orthostasis, so goal BP is <150/90.  # CV Disease Prevention: CMr. Burnard has an upcoming appointment with his PCP and will get his lipids checked.  # Falls: Seem to have improved somewhat with increased hydration.  He was noted to be orthostatic at his last clinic appointment.  Agree with neurology evaluation.    Current medicines are reviewed at length with the patient today.  The patient does not have concerns regarding medicines.  The following changes have been made:  no change  Labs/ tests ordered today include:   Orders Placed This Encounter  Procedures  . ECHOCARDIOGRAM COMPLETE  . Pulmonary function test     Disposition:   FU with Leilana Mcquire C. Oval Linsey, MD, [facility] in 1 year   This note was written with the assistance of speech recognition software.  Please excuse any transcriptional errors.  Signed, Olyvia Gopal C. Oval Linsey, MD, [facility]  10/20/2015 8:04 AM    [place]

## 2015-10-18 ENCOUNTER — Ambulatory Visit
Admission: RE | Admit: 2015-10-18 | Discharge: 2015-10-18 | Disposition: A | Discharge status: Home / Self Care | Payer: Medicare Other | Source: Ambulatory Visit | Attending: Endocrinology | Admitting: Endocrinology

## 2015-10-18 DIAGNOSIS — R29898 Other symptoms and signs involving the musculoskeletal system: Secondary | ICD-10-CM

## 2015-10-18 DIAGNOSIS — I6523 Occlusion and stenosis of bilateral carotid arteries: Secondary | ICD-10-CM | POA: Diagnosis not present

## 2015-10-18 DIAGNOSIS — W19XXXA Unspecified fall, initial encounter: Secondary | ICD-10-CM

## 2015-10-18 DIAGNOSIS — R55 Syncope and collapse: Secondary | ICD-10-CM | POA: Diagnosis not present

## 2015-10-20 ENCOUNTER — Encounter: Payer: Self-pay | Admitting: Cardiovascular Disease

## 2015-10-20 DIAGNOSIS — I7121 Aneurysm of the ascending aorta, without rupture: Secondary | ICD-10-CM | POA: Insufficient documentation

## 2015-10-20 DIAGNOSIS — I712 Thoracic aortic aneurysm, without rupture: Secondary | ICD-10-CM | POA: Insufficient documentation

## 2015-10-22 ENCOUNTER — Ambulatory Visit (HOSPITAL_COMMUNITY)
Admission: RE | Admit: 2015-10-22 | Discharge: 2015-10-22 | Disposition: A | Discharge status: Home / Self Care | Payer: Medicare Other | Source: Ambulatory Visit | Attending: Cardiovascular Disease | Admitting: Cardiovascular Disease

## 2015-10-22 DIAGNOSIS — R0602 Shortness of breath: Secondary | ICD-10-CM | POA: Diagnosis not present

## 2015-10-22 LAB — PULMONARY FUNCTION TEST
DL/VA % pred: 94 %
DL/VA: 4.16 ml/min/mmHg/L
DLCO unc % pred: 75 %
DLCO unc: 21.48 ml/min/mmHg
FEF 25-75 PRE: 2.78 L/s
FEF 25-75 Post: 2.81 L/sec
FEF2575-%CHANGE-POST: 0 %
FEF2575-%Pred-Post: 127 %
FEF2575-%Pred-Pre: 126 %
FEV1-%CHANGE-POST: 3 %
FEV1-%PRED-POST: 98 %
FEV1-%PRED-PRE: 95 %
FEV1-POST: 2.83 L
FEV1-PRE: 2.74 L
FEV1FVC-%CHANGE-POST: 0 %
FEV1FVC-%Pred-Pre: 107 %
FEV6-%CHANGE-POST: 4 %
FEV6-%PRED-PRE: 91 %
FEV6-%Pred-Post: 95 %
FEV6-PRE: 3.36 L
FEV6-Post: 3.53 L
FEV6FVC-%Change-Post: 0 %
FEV6FVC-%PRED-PRE: 105 %
FEV6FVC-%Pred-Post: 106 %
FVC-%CHANGE-POST: 2 %
FVC-%PRED-POST: 89 %
FVC-%PRED-PRE: 87 %
FVC-POST: 3.53 L
FVC-Pre: 3.45 L
POST FEV6/FVC RATIO: 100 %
PRE FEV6/FVC RATIO: 99 %
Post FEV1/FVC ratio: 80 %
Pre FEV1/FVC ratio: 79 %
RV % PRED: 117 %
RV: 2.68 L
TLC % pred: 95 %
TLC: 6.14 L

## 2015-10-22 MED ORDER — ALBUTEROL SULFATE (2.5 MG/3ML) 0.083% IN NEBU
2.5000 mg | INHALATION_SOLUTION | Freq: Once | RESPIRATORY_TRACT | Status: AC
  Administered 2015-10-22: 2.5 mg via RESPIRATORY_TRACT

## 2015-10-26 ENCOUNTER — Telehealth: Payer: Self-pay | Admitting: *Deleted

## 2015-10-26 DIAGNOSIS — R0602 Shortness of breath: Secondary | ICD-10-CM

## 2015-10-26 NOTE — Telephone Encounter (Signed)
-----   Message from Skeet Latch, MD sent at 10/25/2015  7:49 AM EDT ----- Breathing study is very mildly abnormal, but likely doesn't explain his shortness of breath.

## 2015-10-26 NOTE — Telephone Encounter (Signed)
Advisee wife of PFT results Wife concerned on next step and if he should be referred to pulmonary or what  Will forward to Dr Oval Linsey for review

## 2015-10-27 DIAGNOSIS — N183 Chronic kidney disease, stage 3 (moderate): Secondary | ICD-10-CM | POA: Diagnosis not present

## 2015-10-27 DIAGNOSIS — Z23 Encounter for immunization: Secondary | ICD-10-CM | POA: Diagnosis not present

## 2015-10-27 DIAGNOSIS — N184 Chronic kidney disease, stage 4 (severe): Secondary | ICD-10-CM | POA: Diagnosis not present

## 2015-10-27 DIAGNOSIS — N2581 Secondary hyperparathyroidism of renal origin: Secondary | ICD-10-CM | POA: Diagnosis not present

## 2015-10-27 DIAGNOSIS — Z8589 Personal history of malignant neoplasm of other organs and systems: Secondary | ICD-10-CM | POA: Diagnosis not present

## 2015-10-27 DIAGNOSIS — D631 Anemia in chronic kidney disease: Secondary | ICD-10-CM | POA: Diagnosis not present

## 2015-10-27 DIAGNOSIS — H9193 Unspecified hearing loss, bilateral: Secondary | ICD-10-CM | POA: Diagnosis not present

## 2015-10-27 DIAGNOSIS — I10 Essential (primary) hypertension: Secondary | ICD-10-CM | POA: Diagnosis not present

## 2015-10-27 DIAGNOSIS — D472 Monoclonal gammopathy: Secondary | ICD-10-CM | POA: Diagnosis not present

## 2015-10-27 DIAGNOSIS — N2589 Other disorders resulting from impaired renal tubular function: Secondary | ICD-10-CM | POA: Diagnosis not present

## 2015-10-27 DIAGNOSIS — G629 Polyneuropathy, unspecified: Secondary | ICD-10-CM | POA: Diagnosis not present

## 2015-10-27 DIAGNOSIS — E039 Hypothyroidism, unspecified: Secondary | ICD-10-CM | POA: Diagnosis not present

## 2015-10-27 DIAGNOSIS — E118 Type 2 diabetes mellitus with unspecified complications: Secondary | ICD-10-CM | POA: Diagnosis not present

## 2015-10-28 NOTE — Telephone Encounter (Signed)
I don't think this is the cause of his shortness of breath, but reasonable to refer to Pulmonary if they would like.

## 2015-10-29 NOTE — Telephone Encounter (Signed)
Advised wife and referral placed in Epic

## 2015-11-08 ENCOUNTER — Encounter: Payer: Self-pay | Admitting: Emergency Medicine

## 2015-11-08 ENCOUNTER — Ambulatory Visit (INDEPENDENT_AMBULATORY_CARE_PROVIDER_SITE_OTHER): Payer: Medicare Other | Admitting: Emergency Medicine

## 2015-11-08 VITALS — BP 150/90 | HR 78 | Ht 67.5 in | Wt 167.0 lb

## 2015-11-08 DIAGNOSIS — R2689 Other abnormalities of gait and mobility: Secondary | ICD-10-CM | POA: Diagnosis not present

## 2015-11-08 DIAGNOSIS — T17908A Unspecified foreign body in respiratory tract, part unspecified causing other injury, initial encounter: Secondary | ICD-10-CM

## 2015-11-08 DIAGNOSIS — R0602 Shortness of breath: Secondary | ICD-10-CM

## 2015-11-08 NOTE — Assessment & Plan Note (Addendum)
Significant exertional dyspnea and absence of restriction or obstruction on his blurry function testing. He did have slightly decreased diffusion capacity. Symptoms have crossover with weakness, imbalance, near syncope. I question whether there may be some component of cardiac output or even a neuromuscular weakness. Pulmonary function testing does not support this so far, consider a MIP / MEP. I will perform a ventilation perfusion scan given the diffusion defect. His spirometry suggests possible variable upper airway obstruction and I would like to check a CT scan of his neck given his history of cancer. It may be necessary to visualize his upper airway with the bronchoscope. May be able to reproduce his symptoms on a walking oximetry today. I think he likely needs a cardiac exercise test to figure out which issue is limiting him. He can't do this with ambulation because of his imbalance. We would need to do this on a stationary bicycle.  We will perform a walking oximetry today.  We will refer you back to neurology to further evaluate your imbalance.  We will plan for a cardiopulmonary exercise test on a stationary bike to see if we can reproduce your symptoms.  We will perform a ventilation perfusion scan.  We will perform a CT scan of the neck to insure no airway blockage Depending on the workup we may decide to look at the upper airway with a bronchoscope to evaluate for obstruction.  Follow with Dr Lamonte Sakai in 1 month

## 2015-11-08 NOTE — Progress Notes (Signed)
Subjective:    Patient ID: Todd Hall, male    DOB: [birth year], 70 y.o.   MRN: [id number]  HPI  70 year old former smoker (5 pk-yrs) with a history of head and neck cancer status post chemoradiation 2004, did not have sgy, chronic kidney disease, DM, GERD, hyperlipidemia, HTN. He is currently under evaluation for complaint of exertional dyspnea, instability and falls that may have been related to syncope. His evaluation thus far has revealed some evidence for grade 1 diastolic dysfunction, orthostatic hypotension. He underwent chemical stress test on/6/17 that was negative for any evidence of ischemia. Part of his evaluation has included pulmonary function testing that was performed on 10/22/15 that I have personally reviewed. This showed normal airflows without a bronchodilator response, low lung volumes, slightly decreased diffusion capacity corrected to normal range when it was adjusted for alveolar volume. The flow volume loop showed some inspiratory variability that could be consistent with some variable upper airway obstruction.   He reports that he developed exercise intolerance about 6 months ago, has progressed. He has had not had wheeze. He does have sinus drainage that is worst at night, causes some cough. He is still able to tend animals but is having more trouble with it. He has had some chest pain w exertion before, but not every time. Much of what he is reporting seems to be associated with sx of weakness, imbalance - difficult to tease out which is limiting him. He denies ptosis, diplopia, blurred vision     Review of Systems  Constitutional: Negative for fever and unexpected weight change.  HENT: Positive for trouble swallowing. Negative for congestion, dental problem, ear pain, nosebleeds, postnasal drip, rhinorrhea, sinus pressure, sneezing and sore throat.   Eyes: Negative for redness and itching.  Respiratory: Positive for cough and shortness of breath. Negative for chest  tightness and wheezing.   Cardiovascular: Negative for palpitations and leg swelling.  Gastrointestinal: Negative for nausea and vomiting.  Genitourinary: Negative for dysuria.  Musculoskeletal: Negative for joint swelling.  Skin: Negative for rash.  Neurological: Negative for headaches.  Hematological: Does not bruise/bleed easily.  Psychiatric/Behavioral: Negative for dysphoric mood. The patient is not nervous/anxious.    Past Medical History:  Diagnosis Date  . Abnormality of gait 08/08/2013  . Angina    h/o cp  . Arthritis    arthritis in neck and spine  . Cancer ([place])    tongue, throat and neck cancer - treated with chemo and radiation 2004  . Chest pain 09/14/2015  . Chronic kidney disease    "kidney failure" sees dr. Esau Grew - takes med for this - not on HD  . Diabetes mellitus    diagnosed about 15 yrs ago  . Diabetes mellitus type 2 in nonobese ([place]) 09/14/2015  . Diabetic retinopathy ([place])   . Dysrhythmia    h/o being told heart was too large as a child and that it beat too fast  . Falls 09/14/2015  . GERD (gastroesophageal reflux disease)   . Headache(784.0)    h/o headaches and migraines when younger  . History of syncope   . HOH (hard of hearing)   . Hyperlipidemia   . Hypertension    saw a cardiologist in [place] at least 10 yrs ago.  Had stress test 10 yrs ago.  Marland Kitchen Hypothyroidism    takes synthroid - unsure if problem is hypo or hyper  . Myocardial infarction    h/o 2 small MIs due to his looks of ekg.  Marland Kitchen  Pneumonia    pneumonia as a child  . Polyneuropathy in diabetes(357.2) 08/08/2013  . Shortness of breath 09/14/2015  . Syncope 09/14/2015  . Throat cancer [facility])      Family History  Problem Relation Age of Onset  . Diabetes Mother   . Heart Problems Father   . CAD Father   . Heart Problems Brother   . Heart Problems Brother   . Peripheral Artery Disease Brother   . Diabetes Brother   . Diabetes Brother   . Peripheral Artery Disease Brother       Social History   Social History  . Marital status: Married    Spouse name: N/A  . Number of children: 2  . Years of education: 9th   Occupational History  . Retired    Social History Main Topics  . Smoking status: Former Smoker    Packs/day: 0.25    Years: 20.00    Types: Cigarettes    Quit date: 12/20/1980  . Smokeless tobacco: Never Used  . Alcohol use No     Comment: when young drank beers on weekend  . Drug use: No  . Sexual activity: Yes   Other Topics Concern  . Not on file   Social History Narrative  . No narrative on file     No Known Allergies   Outpatient Medications Prior to Visit  Medication Sig Dispense Refill  . aspirin EC 81 MG tablet Take 81 mg by mouth daily.    . calcitRIOL (ROCALTROL) 0.25 MCG capsule Take 1 capsule by mouth daily.    . furosemide (LASIX) 40 MG tablet Take 40 mg by mouth daily.      Marland Kitchen gabapentin (NEURONTIN) 400 MG capsule Take 1 capsule by mouth 2 (two) times daily.    Marland Kitchen HYDROcodone-acetaminophen (VICODIN) 5-500 MG per tablet Take 1 tablet by mouth every 6 (six) hours as needed. For pain    . insulin lispro protamine-insulin lispro (HUMALOG 50/50) (50-50) 100 UNIT/ML SUSP Inject 22-24 Units into the skin 2 (two) times daily before a meal. 24 units in the a.m. And 22 units in the p.m.     Marland Kitchen omeprazole (PRILOSEC) 20 MG capsule Take 20 mg by mouth daily.      . sodium bicarbonate 650 MG tablet Take 650 mg by mouth 2 (two) times daily.      . vitamin B-12 (CYANOCOBALAMIN) 1000 MCG tablet Take 1,000 mcg by mouth daily.     No facility-administered medications prior to visit.         Objective:   Physical Exam  Vitals:   11/08/15 1607  BP: (!) 150/90  Pulse: 78  SpO2: 98%  Weight: 167 lb (75.8 kg)  Height: 5' 7.5" (1.715 m)   Gen: Pleasant, well-nourished, in no distress,  normal affect  ENT: No lesions,  mouth clear,  oropharynx clear, no postnasal drip, very HOH  Neck: No JVD, no TMG, no carotid bruits, slight fullness  R neck, no stridor  Lungs: No use of accessory muscles, clear without rales or rhonchi  Cardiovascular: RRR, heart sounds normal, no murmur or gallops, no peripheral edema  Musculoskeletal: No deformities, no cyanosis or clubbing  Neuro: alert, non focal  Skin: Warm, no lesions or rash      Assessment & Plan:  Shortness of breath Significant exertional dyspnea and absence of restriction or obstruction on his blurry function testing. He did have slightly decreased diffusion capacity. Symptoms have crossover with weakness, imbalance, near syncope. I question  whether there may be some component of cardiac output or even a neuromuscular weakness. Pulmonary function testing does not support this so far, consider a MIP / MEP. I will perform a ventilation perfusion scan given the diffusion defect. His spirometry suggests possible variable upper airway obstruction and I would like to check a CT scan of his neck given his history of cancer. It may be necessary to visualize his upper airway with the bronchoscope. May be able to reproduce his symptoms on a walking oximetry today. I think he likely needs a cardiac exercise test to figure out which issue is limiting him. He can't do this with ambulation because of his imbalance. We would need to do this on a stationary bicycle.  We will perform a walking oximetry today.  We will refer you back to neurology to further evaluate your imbalance.  We will plan for a cardiopulmonary exercise test on a stationary bike to see if we can reproduce your symptoms.  We will perform a ventilation perfusion scan.  We will perform a CT scan of the neck to insure no airway blockage Depending on the workup we may decide to look at the upper airway with a bronchoscope to evaluate for obstruction.  Follow with Dr Lamonte Sakai in 1 month  Baltazar Apo, MD, PhD 11/08/2015, 4:40 PM [facility] [phone] or if no answer [phone]

## 2015-11-08 NOTE — Addendum Note (Signed)
Addended by: Desmond Dike C on: 11/08/2015 04:59 PM   Modules accepted: Orders

## 2015-11-08 NOTE — Patient Instructions (Addendum)
We will perform a walking oximetry today.  We will refer you back to neurology to further evaluate your imbalance.  We will plan for a cardiopulmonary exercise test on a stationary bike to see if we can reproduce your symptoms.  We will perform a ventilation perfusion scan.  We will perform a CT scan of the neck to insure no airway blockage Depending on the workup we may decide to look at the upper airway with a bronchoscope to evaluate for obstruction.  Follow with Dr Lamonte Sakai in 1 month

## 2015-11-09 ENCOUNTER — Other Ambulatory Visit (INDEPENDENT_AMBULATORY_CARE_PROVIDER_SITE_OTHER): Payer: Medicare Other

## 2015-11-09 DIAGNOSIS — R0602 Shortness of breath: Secondary | ICD-10-CM

## 2015-11-09 LAB — BASIC METABOLIC PANEL
BUN: 36 mg/dL — AB (ref 6–23)
CALCIUM: 9.7 mg/dL (ref 8.4–10.5)
CO2: 33 mEq/L — ABNORMAL HIGH (ref 19–32)
CREATININE: 2.28 mg/dL — AB (ref 0.40–1.50)
Chloride: 98 mEq/L (ref 96–112)
GFR: 30.32 mL/min — AB (ref >60.00)
GLUCOSE: 191 mg/dL — AB (ref 70–99)
Potassium: 4.9 mEq/L (ref 3.5–5.1)
Sodium: 136 mEq/L (ref 135–145)

## 2015-11-11 ENCOUNTER — Ambulatory Visit (INDEPENDENT_AMBULATORY_CARE_PROVIDER_SITE_OTHER): Payer: Medicare Other | Admitting: Neurology

## 2015-11-11 ENCOUNTER — Encounter: Payer: Self-pay | Admitting: Neurology

## 2015-11-11 VITALS — BP 151/83 | HR 77 | Ht 67.5 in | Wt 164.5 lb

## 2015-11-11 DIAGNOSIS — R269 Unspecified abnormalities of gait and mobility: Secondary | ICD-10-CM | POA: Diagnosis not present

## 2015-11-11 DIAGNOSIS — E538 Deficiency of other specified B group vitamins: Secondary | ICD-10-CM

## 2015-11-11 NOTE — Progress Notes (Signed)
Reason for visit: Left-sided weakness  Referring physician: Dr. Dwaine Deter is a 70 y.o. male  History of present illness:  Todd Hall is a 70 year old right-handed white male with a history of diabetes with a peripheral neuropathy. The patient was seen several years ago with some gait instability. Nerve conduction studies at that time did not show a significant peripheral neuropathy. The patient has had a new issue that has occurred within the last 2 months. The patient has had onset of left-sided numbness and weakness, gait instability that has come on and has had significant undulations in severity. The patient at times will walk fairly well, other times he cannot walk at all, with significant dysfunction of the left side of the body. At times, the left arm and leg will become floppy. The patient has persistent numbness and coordination problems with the left arm and leg. He has had some slowness of speech, no slurred speech. He denies any headaches or visual field changes. He does have significant neck discomfort and pain into the shoulders bilaterally. He denies any issues controlling the bowels or the bladder with exception that he does have some urinary frequency. He denies low back pain. He has undergone a CT scan of the brain that showed no acute changes. A carotid Doppler study and a 2-D echocardiogram has also been done without any definite source of embolus to the brain. The patient is sent to this office for further evaluation. The patient remains on low-dose aspirin.  Past Medical History:  Diagnosis Date  . Abnormality of gait 08/08/2013  . Angina    h/o cp  . Arthritis    arthritis in neck and spine  . Cancer ([place])    tongue, throat and neck cancer - treated with chemo and radiation 2004  . Chest pain 09/14/2015  . Chronic kidney disease    "kidney failure" sees dr. Esau Grew - takes med for this - not on HD  . Diabetes mellitus    diagnosed about 15 yrs ago  .  Diabetes mellitus type 2 in nonobese ([place]) 09/14/2015  . Diabetic retinopathy ([place])   . Dysrhythmia    h/o being told heart was too large as a child and that it beat too fast  . Falls 09/14/2015  . GERD (gastroesophageal reflux disease)   . Headache(784.0)    h/o headaches and migraines when younger  . History of syncope   . HOH (hard of hearing)   . Hyperlipidemia   . Hypertension    saw a cardiologist in [place] at least 10 yrs ago.  Had stress test 10 yrs ago.  Marland Kitchen Hypothyroidism    takes synthroid - unsure if problem is hypo or hyper  . Myocardial infarction    h/o 2 small MIs due to his looks of ekg.  . Pneumonia    pneumonia as a child  . Polyneuropathy in diabetes(357.2) 08/08/2013  . Shortness of breath 09/14/2015  . Syncope 09/14/2015  . Throat cancer [facility])     Past Surgical History:  Procedure Laterality Date  . ANTERIOR CERVICAL DECOMP/DISCECTOMY FUSION  12/27/2010   Procedure: ANTERIOR CERVICAL DECOMPRESSION/DISCECTOMY FUSION 1 LEVEL;  Surgeon: [name], MD;  Location: [facility];  Service: Neurosurgery;  Laterality: N/A;  Cervical six-seven anterior cervical decompression fusion with interbody prosthesis and plating  . Laser surgery, retina     Diabetic retinopathy  . NASAL SINUS SURGERY    . port-a-cath placement    . PORT-A-CATH  REMOVAL  2005    Family History  Problem Relation Age of Onset  . Diabetes Mother   . Heart Problems Father   . CAD Father   . Heart Problems Brother   . Heart Problems Brother   . Peripheral Artery Disease Brother   . Diabetes Brother   . Diabetes Brother   . Peripheral Artery Disease Brother     Social history:  reports that he quit smoking about 34 years ago. His smoking use included Cigarettes. He has a 5.00 pack-year smoking history. He has never used smokeless tobacco. He reports that he does not drink alcohol or use drugs.  Medications:  Prior to Admission medications   Medication Sig Start Date End Date Taking?  Authorizing Provider  aspirin EC 81 MG tablet Take 81 mg by mouth daily.   Yes Historical Provider, MD  calcitRIOL (ROCALTROL) 0.25 MCG capsule Take 1 capsule by mouth daily. 06/10/13  Yes Historical Provider, MD  furosemide (LASIX) 40 MG tablet Take 40 mg by mouth daily.     Yes Historical Provider, MD  gabapentin (NEURONTIN) 400 MG capsule Take 1 capsule by mouth 2 (two) times daily. 09/14/15  Yes Historical Provider, MD  HYDROcodone-acetaminophen (VICODIN) 5-500 MG per tablet Take 1 tablet by mouth every 6 (six) hours as needed. For pain   Yes Historical Provider, MD  insulin lispro protamine-insulin lispro (HUMALOG 50/50) (50-50) 100 UNIT/ML SUSP Inject 22-24 Units into the skin 2 (two) times daily before a meal. 24 units in the a.m. And 22 units in the p.m.    Yes Historical Provider, MD  omeprazole (PRILOSEC) 20 MG capsule Take 20 mg by mouth daily.     Yes Historical Provider, MD  sodium bicarbonate 650 MG tablet Take 650 mg by mouth 2 (two) times daily.     Yes Historical Provider, MD  vitamin B-12 (CYANOCOBALAMIN) 1000 MCG tablet Take 1,000 mcg by mouth daily.   Yes Historical Provider, MD     No Known Allergies  ROS:  Out of a complete 14 system review of symptoms, the patient complains only of the following symptoms, and all other reviewed systems are negative.  Fatigue Hearing loss, ringing in the ears, difficulty swallowing Joint pain, achy muscles Numbness, weakness, passing out  Blood pressure (!) 151/83, pulse 77, height 5' 7.5" (1.715 m), weight 164 lb 8 oz (74.6 kg).  Physical Exam  General: The patient is alert and cooperative at the time of the examination. The patient is hard of hearing.  Eyes: Pupils are equal, round, and reactive to light. Discs are flat bilaterally.  Neck: The neck is supple, no carotid bruits are noted.  Respiratory: The respiratory examination is clear.  Cardiovascular: The cardiovascular examination reveals a regular rate and rhythm, no  obvious murmurs or rubs are noted.  Skin: Extremities are without significant edema.  Neurologic Exam  Mental status: The patient is alert and oriented x 3 at the time of the examination. The patient has apparent normal recent and remote memory, with an apparently normal attention span and concentration ability.  Cranial nerves: Facial symmetry is present. There is good sensation of the face to pinprick and soft touch on the right, some decrease in pinprick sensation on the left forehead. The strength of the facial muscles and the muscles to head turning and shoulder shrug are normal bilaterally. Speech is well enunciated, no aphasia or dysarthria is noted. Extraocular movements are full. Visual fields are full. The tongue is midline, and the patient  has symmetric elevation of the soft palate. No obvious hearing deficits are noted.  Motor: The motor testing reveals 5 over 5 strength of all 4 extremities. Good symmetric motor tone is noted throughout.  Sensory: Sensory testing is notable for some decrease in pinprick sensation on the left arm and left leg, decreased vibration sensation on the left arm relative to the right, decreased in both feet to some degree, worse on the left. Position sense is relatively intact on all 4 extremities. No evidence of extinction is noted.  Coordination: Cerebellar testing reveals good heel-to-shin bilaterally. Dysmetria is seen with finger-nose-finger on the left, not present on the right.  Gait and station: Gait is minimally wide-based. Tandem gait is unsteady. Romberg is negative. No drift is seen.  Reflexes: Deep tendon reflexes are symmetric and normal bilaterally. Toes are downgoing bilaterally.   CT brain 10/18/15:  IMPRESSION: Age related volume loss with mild patchy periventricular small vessel disease. Small prior lacunar infarct left thalamus. No acute infarct evident. No intracranial mass, hemorrhage, or extra-axial fluid collection. Scattered  foci of calcification in each carotid siphon. Mucosal thickening is noted in multiple paranasal sinus [place].  * CT scan images were reviewed online. I agree with the written report.   Carotid doppler 10/18/15:  IMPRESSION: 1. Estimated 50- 69% proximal right ICA stenosis with moderate plaque present at the right carotid bifurcation. 2. Estimated less than 50% left ICA stenosis.   2D echo 09/30/15:  Impressions:  - Normal LV size with mild LV hypertrophy. EF 55-60%. Normal RV   size and systolic function. No significant valvular   abnormalities.    Assessment/Plan:  1. Gait disturbance  2. Left hemisensory deficit  The patient reports onset of problems with left-sided numbness and weakness. The patient has had persistence of left-sided numbness, dysmetria involving the left arm. The patient will have events associated with increased weakness and numbness, and inability to walk. The patient may have suffered a subcortical right brain stroke. He could potentially have a high-grade intracranial stenosis that results in undulation of symptoms. The patient is on low-dose aspirin. We will check MRI of the brain and check blood work today. The patient will be set for MRI of the cervical spine if the MRI of the brain does not explain his symptoms. He will follow-up in 2 months.  Todd Alexanders MD 11/11/2015 1:56 PM  [facility] [address] [place], [place] [address]  Phone [phone] Fax [phone]

## 2015-11-12 DIAGNOSIS — N183 Chronic kidney disease, stage 3 (moderate): Secondary | ICD-10-CM | POA: Diagnosis not present

## 2015-11-13 LAB — RPR: RPR Ser Ql: NONREACTIVE

## 2015-11-13 LAB — SEDIMENTATION RATE: SED RATE: 13 mm/h (ref 0–30)

## 2015-11-13 LAB — VITAMIN B12: Vitamin B-12: 818 pg/mL (ref 211–946)

## 2015-11-13 LAB — COPPER, SERUM: Copper: 84 ug/dL (ref 72–166)

## 2015-11-15 ENCOUNTER — Telehealth: Payer: Self-pay

## 2015-11-15 NOTE — Telephone Encounter (Signed)
-----   Message from Kathrynn Ducking, MD sent at 11/14/2015  9:25 AM EDT -----  The blood work results are unremarkable. Please call the patient.  ----- Message ----- From: Lavone Neri Lab Results In Sent: 11/12/2015   7:42 AM To: Kathrynn Ducking, MD

## 2015-11-15 NOTE — Telephone Encounter (Signed)
Called w/ unremarkable lab results. May call back w/ additional questions/concerns. 

## 2015-11-17 ENCOUNTER — Other Ambulatory Visit: Payer: Self-pay | Admitting: Emergency Medicine

## 2015-11-17 ENCOUNTER — Ambulatory Visit (HOSPITAL_COMMUNITY)
Admission: RE | Admit: 2015-11-17 | Discharge: 2015-11-17 | Disposition: A | Discharge status: Home / Self Care | Payer: Medicare Other | Source: Ambulatory Visit | Attending: Emergency Medicine | Admitting: Emergency Medicine

## 2015-11-17 DIAGNOSIS — T17908A Unspecified foreign body in respiratory tract, part unspecified causing other injury, initial encounter: Secondary | ICD-10-CM

## 2015-11-17 DIAGNOSIS — R1310 Dysphagia, unspecified: Secondary | ICD-10-CM | POA: Diagnosis not present

## 2015-11-17 DIAGNOSIS — I6529 Occlusion and stenosis of unspecified carotid artery: Secondary | ICD-10-CM | POA: Insufficient documentation

## 2015-11-17 DIAGNOSIS — I700 Atherosclerosis of aorta: Secondary | ICD-10-CM | POA: Diagnosis not present

## 2015-11-17 DIAGNOSIS — R0602 Shortness of breath: Secondary | ICD-10-CM

## 2015-11-17 DIAGNOSIS — J9811 Atelectasis: Secondary | ICD-10-CM | POA: Diagnosis not present

## 2015-11-17 MED ORDER — TECHNETIUM TC 99M DIETHYLENETRIAME-PENTAACETIC ACID: 30.0000 | Freq: Once | INTRAVENOUS | Status: DC | PRN

## 2015-11-17 MED ORDER — IOPAMIDOL (ISOVUE-300) INJECTION 61%
INTRAVENOUS | Status: AC
  Filled 2015-11-17: qty 75

## 2015-11-17 MED ORDER — TECHNETIUM TO 99M ALBUMIN AGGREGATED
4.0000 | Freq: Once | INTRAVENOUS | Status: AC | PRN
  Administered 2015-11-17: 4 via INTRAVENOUS

## 2015-11-25 ENCOUNTER — Ambulatory Visit
Admission: RE | Admit: 2015-11-25 | Discharge: 2015-11-25 | Disposition: A | Discharge status: Home / Self Care | Payer: Medicare Other | Source: Ambulatory Visit | Attending: Neurology | Admitting: Neurology

## 2015-11-25 DIAGNOSIS — R2689 Other abnormalities of gait and mobility: Secondary | ICD-10-CM | POA: Diagnosis not present

## 2015-11-25 DIAGNOSIS — R930 Abnormal findings on diagnostic imaging of skull and head, not elsewhere classified: Secondary | ICD-10-CM | POA: Diagnosis not present

## 2015-11-25 DIAGNOSIS — R269 Unspecified abnormalities of gait and mobility: Secondary | ICD-10-CM

## 2015-11-29 ENCOUNTER — Telehealth: Payer: Self-pay | Admitting: Neurology

## 2015-11-29 NOTE — Telephone Encounter (Signed)
I called patient. MRI the brain that showed a right thalamic and right CSO stroke that is new from 2013, this could potentially explain the symptoms of left-sided numbness and the slight clumsiness that he has on the left side. I have asked him to go up from 81 mg of aspirin to 325 mg.  MRI brain 11/25/15:  IMPRESSION:  Abnormal MRI scan of brain showing remote age lacunar infarcts in bilateral thalamus and right corona radiata and mild changes of chronic microvascular disease and generalized cerebral atrophy. Compared to MRI 07/13/11 the right thalamic and corona radiata infarcts appear new and microvascular ischemic changes appear more advanced.

## 2015-11-29 NOTE — Telephone Encounter (Signed)
Pt's wife called needing clarification on the aspirin. She said DR W said to take a hole aspiring and she wants to know what that is. Message was relayed, she requested to have RN call. Please call [phone]

## 2015-11-29 NOTE — Telephone Encounter (Signed)
Returned call to pt's wife. Answered questions about aspirin dosage and stroke prevention. Let her know that Dr. Jannifer Franklin recommends that pt increase aspirin to 325 mg daily for higher anti-coagulant therapy. Also reviewed side effects such as easier bruising/bleeding. Verbalized understanding and appreciation for call.

## 2015-11-30 ENCOUNTER — Ambulatory Visit (HOSPITAL_COMMUNITY): Payer: Medicare Other | Attending: Emergency Medicine

## 2015-11-30 DIAGNOSIS — R0602 Shortness of breath: Secondary | ICD-10-CM

## 2015-11-30 DIAGNOSIS — R0600 Dyspnea, unspecified: Secondary | ICD-10-CM | POA: Insufficient documentation

## 2015-12-14 DIAGNOSIS — R0600 Dyspnea, unspecified: Secondary | ICD-10-CM | POA: Diagnosis not present

## 2015-12-15 ENCOUNTER — Ambulatory Visit (INDEPENDENT_AMBULATORY_CARE_PROVIDER_SITE_OTHER): Payer: Medicare Other | Admitting: Emergency Medicine

## 2015-12-15 ENCOUNTER — Encounter: Payer: Self-pay | Admitting: Emergency Medicine

## 2015-12-15 DIAGNOSIS — R0602 Shortness of breath: Secondary | ICD-10-CM

## 2015-12-15 NOTE — Progress Notes (Signed)
Subjective:    Patient ID: Todd Hall, male    DOB: April 01, 1945, 70 y.o.   MRN: [id number]  HPI  70 year old former smoker (5 pk-yrs) with a history of head and neck cancer status post chemoradiation 2004, did not have sgy, chronic kidney disease, DM, GERD, hyperlipidemia, HTN. He is currently under evaluation for complaint of exertional dyspnea, instability and falls that may have been related to syncope. His evaluation thus far has revealed some evidence for grade 1 diastolic dysfunction, orthostatic hypotension. He underwent chemical stress test on/6/17 that was negative for any evidence of ischemia. Part of his evaluation has included pulmonary function testing that was performed on 10/22/15 that I have personally reviewed. This showed normal airflows without a bronchodilator response, low lung volumes, slightly decreased diffusion capacity corrected to normal range when it was adjusted for alveolar volume. The flow volume loop showed some inspiratory variability that could be consistent with some variable upper airway obstruction.   He reports that he developed exercise intolerance about 6 months ago, has progressed. He has had not had wheeze. He does have sinus drainage that is worst at night, causes some cough. He is still able to tend animals but is having more trouble with it. He has had some chest pain w exertion before, but not every time. Much of what he is reporting seems to be associated with sx of weakness, imbalance - difficult to tease out which is limiting him. He denies ptosis, diplopia, blurred vision  ROV 12/15/15 -- This is a follow-up visit and a never smoker to evaluate dyspnea. We undertook several tests in order to look for an etiology. This included a CT scan of his neck that I have reviewed. This showed a mildly patulous esophagus with no upper airway abnormality at all. He also had a VQ scan on 11/17/15 that showed no evidence to support pulmonary emboli. A cardio pulmonary  exercise test on 12/03/15 was reviewed by me. This showed normal pre-and posttest spirometry. Her was mild functional impairment without any evidence for bronchospasm. He did become hypertensive after exercise. He saw neurology and underwent an MRI of the brain that showed new right thalamic and corona radiata infarcts compared with 2013.  He continues to get exertional fatigue and dyspnea. Has a chronic cough and sinus drainage.      Review of Systems  Constitutional: Negative for fever and unexpected weight change.  HENT: Positive for trouble swallowing. Negative for congestion, dental problem, ear pain, nosebleeds, postnasal drip, rhinorrhea, sinus pressure, sneezing and sore throat.   Eyes: Negative for redness and itching.  Respiratory: Positive for cough and shortness of breath. Negative for chest tightness and wheezing.   Cardiovascular: Negative for palpitations and leg swelling.  Gastrointestinal: Negative for nausea and vomiting.  Genitourinary: Negative for dysuria.  Musculoskeletal: Negative for joint swelling.  Skin: Negative for rash.  Neurological: Negative for headaches.  Hematological: Does not bruise/bleed easily.  Psychiatric/Behavioral: Negative for dysphoric mood. The patient is not nervous/anxious.    Past Medical History:  Diagnosis Date  . Abnormality of gait 08/08/2013  . Angina    h/o cp  . Arthritis    arthritis in neck and spine  . Cancer ([place])    tongue, throat and neck cancer - treated with chemo and radiation 2004  . Chest pain 09/14/2015  . Chronic kidney disease    "kidney failure" sees dr. Esau Grew - takes med for this - not on HD  . Diabetes mellitus  diagnosed about 15 yrs ago  . Diabetes mellitus type 2 in nonobese ([place]) 09/14/2015  . Diabetic retinopathy ([place])   . Dysrhythmia    h/o being told heart was too large as a child and that it beat too fast  . Falls 09/14/2015  . GERD (gastroesophageal reflux disease)   . Headache(784.0)    h/o  headaches and migraines when younger  . History of syncope   . HOH (hard of hearing)   . Hyperlipidemia   . Hypertension    saw a cardiologist in [place] at least 10 yrs ago.  Had stress test 10 yrs ago.  Marland Kitchen Hypothyroidism    takes synthroid - unsure if problem is hypo or hyper  . Myocardial infarction    h/o 2 small MIs due to his looks of ekg.  . Pneumonia    pneumonia as a child  . Polyneuropathy in diabetes(357.2) 08/08/2013  . Shortness of breath 09/14/2015  . Syncope 09/14/2015  . Throat cancer [facility])      Family History  Problem Relation Age of Onset  . Diabetes Mother   . Heart Problems Father   . CAD Father   . Heart Problems Brother   . Heart Problems Brother   . Peripheral Artery Disease Brother   . Diabetes Brother   . Diabetes Brother   . Peripheral Artery Disease Brother      Social History   Social History  . Marital status: Married    Spouse name: N/A  . Number of children: 2  . Years of education: 9th   Occupational History  . Retired    Social History Main Topics  . Smoking status: Former Smoker    Packs/day: 0.25    Years: 20.00    Types: Cigarettes    Quit date: 12/20/1980  . Smokeless tobacco: Never Used  . Alcohol use No     Comment: when young drank beers on weekend  . Drug use: No  . Sexual activity: Yes    Partners: Female     Comment: Married   Other Topics Concern  . Not on file   Social History Narrative   Lives at home w/ his wife   Right-handed   Caffeine: 4-5 cups coffee per day     No Known Allergies   Outpatient Medications Prior to Visit  Medication Sig Dispense Refill  . aspirin EC 81 MG tablet Take 81 mg by mouth daily.    . calcitRIOL (ROCALTROL) 0.25 MCG capsule Take 1 capsule by mouth daily.    . furosemide (LASIX) 40 MG tablet Take 40 mg by mouth daily.      Marland Kitchen gabapentin (NEURONTIN) 400 MG capsule Take 1 capsule by mouth 2 (two) times daily.    Marland Kitchen HYDROcodone-acetaminophen (VICODIN) 5-500 MG per tablet Take 1  tablet by mouth every 6 (six) hours as needed. For pain    . insulin lispro protamine-insulin lispro (HUMALOG 50/50) (50-50) 100 UNIT/ML SUSP Inject 22-24 Units into the skin 2 (two) times daily before a meal. 24 units in the a.m. And 22 units in the p.m.     Marland Kitchen omeprazole (PRILOSEC) 20 MG capsule Take 20 mg by mouth daily.      . sodium bicarbonate 650 MG tablet Take 650 mg by mouth 2 (two) times daily.      . vitamin B-12 (CYANOCOBALAMIN) 1000 MCG tablet Take 1,000 mcg by mouth daily.     No facility-administered medications prior to visit.  Objective:   Physical Exam  Vitals:   12/15/15 1622  BP: 130/82  Pulse: 65  SpO2: 97%  Weight: 169 lb 12.8 oz (77 kg)  Height: 5' 7.5" (1.715 m)   Gen: Pleasant, well-nourished, in no distress,  normal affect  ENT: No lesions,  mouth clear,  oropharynx clear, no postnasal drip, very HOH  Neck: No JVD, no TMG, no carotid bruits, slight fullness R neck, no stridor  Lungs: No use of accessory muscles, clear without rales or rhonchi  Cardiovascular: RRR, heart sounds normal, no murmur or gallops, no peripheral edema  Musculoskeletal: No deformities, no cyanosis or clubbing  Neuro: alert, non focal  Skin: Warm, no lesions or rash      Assessment & Plan:  Shortness of breath His testing as described in detail above has been reassuring. I dont think there is any benefit to starting bronchodilators. I believe that his dyspnea is due to deconditioning.  No indication for bronchoscopy. I will follow with him as needed.  Please continue to follow with Dr Jannifer Franklin and Dr Oval Linsey We will hold off on starting any inhaled medications at this time/ We need to build your stamina. You would probably benefit from increasing your exercise and conditioning.  We will not perform any other testing at this time.  Follow with Dr Lamonte Sakai as needed for any changes in your breathing.  Baltazar Apo, MD, PhD 12/15/2015, 5:05 PM [facility] [phone] or if no answer [phone]

## 2015-12-15 NOTE — Assessment & Plan Note (Signed)
His testing as described in detail above has been reassuring. I dont think there is any benefit to starting bronchodilators. I believe that his dyspnea is due to deconditioning.  No indication for bronchoscopy. I will follow with him as needed.  Please continue to follow with Dr Jannifer Franklin and Dr Oval Linsey We will hold off on starting any inhaled medications at this time/ We need to build your stamina. You would probably benefit from increasing your exercise and conditioning.  We will not perform any other testing at this time.  Follow with Dr Lamonte Sakai as needed for any changes in your breathing.

## 2015-12-15 NOTE — Patient Instructions (Addendum)
Please continue to follow with Dr Jannifer Franklin and Dr Oval Linsey We will hold off on starting any inhaled medications at this time/ We need to build your stamina. You would probably benefit from increasing your exercise and conditioning.  We will not perform any other testing at this time.  Follow with Dr Lamonte Sakai as needed for any changes in your breathing.

## 2016-01-19 ENCOUNTER — Ambulatory Visit (INDEPENDENT_AMBULATORY_CARE_PROVIDER_SITE_OTHER): Payer: Medicare Other | Admitting: Neurology

## 2016-01-19 ENCOUNTER — Encounter: Payer: Self-pay | Admitting: Neurology

## 2016-01-19 VITALS — BP 134/81 | HR 91 | Ht 67.5 in | Wt 165.0 lb

## 2016-01-19 DIAGNOSIS — I679 Cerebrovascular disease, unspecified: Secondary | ICD-10-CM

## 2016-01-19 DIAGNOSIS — R269 Unspecified abnormalities of gait and mobility: Secondary | ICD-10-CM | POA: Diagnosis not present

## 2016-01-19 DIAGNOSIS — E1142 Type 2 diabetes mellitus with diabetic polyneuropathy: Secondary | ICD-10-CM | POA: Diagnosis not present

## 2016-01-19 HISTORY — DX: Type 2 diabetes mellitus with diabetic polyneuropathy: E11.42

## 2016-01-19 HISTORY — DX: Cerebrovascular disease, unspecified: I67.9

## 2016-01-19 NOTE — Progress Notes (Signed)
Reason for visit: Cerebrovascular disease  Todd Hall is an 71 y.o. male  History of present illness:  Todd Hall is a 71 year old right-handed white male with a history of diabetes and cerebrovascular disease. The patient has sustained a left thalamic and centrum semiovale stroke in the right brain associated with a left hemisensory deficit and sensory ataxia. The patient has been on full dose 325 mg aspirin daily, he has done well since last seen. The patient has noted no new issues. He still has some mild gait instability, he reports no falls. If he is holding something in his left hand he may drop it. The patient does report some mild changes with his swallowing. He returns to this office for an evaluation.  Past Medical History:  Diagnosis Date  . Abnormality of gait 08/08/2013  . Angina    h/o cp  . Arthritis    arthritis in neck and spine  . Cancer ([place])    tongue, throat and neck cancer - treated with chemo and radiation 2004  . Cerebrovascular disease 01/19/2016  . Chest pain 09/14/2015  . Chronic kidney disease    "kidney failure" sees dr. Esau Grew - takes med for this - not on HD  . Diabetes mellitus    diagnosed about 15 yrs ago  . Diabetes mellitus type 2 in nonobese ([place]) 09/14/2015  . Diabetic peripheral neuropathy ([place]) 01/19/2016  . Diabetic retinopathy ([place])   . Dysrhythmia    h/o being told heart was too large as a child and that it beat too fast  . Falls 09/14/2015  . GERD (gastroesophageal reflux disease)   . Headache(784.0)    h/o headaches and migraines when younger  . History of syncope   . HOH (hard of hearing)   . Hyperlipidemia   . Hypertension    saw a cardiologist in [place] at least 10 yrs ago.  Had stress test 10 yrs ago.  Marland Kitchen Hypothyroidism    takes synthroid - unsure if problem is hypo or hyper  . Myocardial infarction    h/o 2 small MIs due to his looks of ekg.  . Pneumonia    pneumonia as a child  . Polyneuropathy in diabetes(357.2)  08/08/2013  . Shortness of breath 09/14/2015  . Syncope 09/14/2015  . Throat cancer [facility])     Past Surgical History:  Procedure Laterality Date  . ANTERIOR CERVICAL DECOMP/DISCECTOMY FUSION  12/27/2010   Procedure: ANTERIOR CERVICAL DECOMPRESSION/DISCECTOMY FUSION 1 LEVEL;  Surgeon: [name], MD;  Location: [facility];  Service: Neurosurgery;  Laterality: N/A;  Cervical six-seven anterior cervical decompression fusion with interbody prosthesis and plating  . Laser surgery, retina     Diabetic retinopathy  . NASAL SINUS SURGERY    . port-a-cath placement    . PORT-A-CATH REMOVAL  2005    Family History  Problem Relation Age of Onset  . Diabetes Mother   . Heart Problems Father   . CAD Father   . Heart Problems Brother   . Heart Problems Brother   . Peripheral Artery Disease Brother   . Diabetes Brother   . Diabetes Brother   . Peripheral Artery Disease Brother     Social history:  reports that he quit smoking about 35 years ago. His smoking use included Cigarettes. He has a 5.00 pack-year smoking history. He has never used smokeless tobacco. He reports that he does not drink alcohol or use drugs.   No Known Allergies  Medications:  Prior  to Admission medications   Medication Sig Start Date End Date Taking? Authorizing Provider  aspirin EC 325 MG tablet Take 325 mg by mouth daily.    Yes Historical Provider, MD  calcitRIOL (ROCALTROL) 0.25 MCG capsule Take 1 capsule by mouth daily. 06/10/13  Yes Historical Provider, MD  furosemide (LASIX) 40 MG tablet Take 40 mg by mouth daily.     Yes Historical Provider, MD  gabapentin (NEURONTIN) 400 MG capsule Take 1 capsule by mouth 2 (two) times daily. 09/14/15  Yes Historical Provider, MD  HYDROcodone-acetaminophen (VICODIN) 5-500 MG per tablet Take 1 tablet by mouth every 6 (six) hours as needed. For pain   Yes Historical Provider, MD  insulin lispro protamine-insulin lispro (HUMALOG 50/50) (50-50) 100 UNIT/ML SUSP Inject 22-24  Units into the skin 2 (two) times daily before a meal. 24 units in the a.m. And 22 units in the p.m.    Yes Historical Provider, MD  omeprazole (PRILOSEC) 20 MG capsule Take 20 mg by mouth daily.     Yes Historical Provider, MD  sodium bicarbonate 650 MG tablet Take 650 mg by mouth 2 (two) times daily.     Yes Historical Provider, MD  vitamin B-12 (CYANOCOBALAMIN) 1000 MCG tablet Take 1,000 mcg by mouth daily.   Yes Historical Provider, MD    ROS:  Out of a complete 14 system review of symptoms, the patient complains only of the following symptoms, and all other reviewed systems are negative.  Gait disturbance Numbness  Blood pressure 134/81, pulse 91, height 5' 7.5" (1.715 m), weight 165 lb (74.8 kg).  Physical Exam  General: The patient is alert and cooperative at the time of the examination.  Skin: No significant peripheral edema is noted.   Neurologic Exam  Mental status: The patient is alert and oriented x 3 at the time of the examination. The patient has apparent normal recent and remote memory, with an apparently normal attention span and concentration ability.   Cranial nerves: Facial symmetry is present. Speech is normal, no aphasia or dysarthria is noted. Extraocular movements are full. Visual fields are full. The patient is hard of hearing.  Motor: The patient has good strength in all 4 extremities.  Sensory examination: Soft touch sensation is symmetric on the face, decreased on the arms and legs.  Coordination: The patient has good finger-nose-finger and heel-to-shin bilaterally.  Gait and station: The patient has a normal gait. Tandem gait is slightly unsteady. Romberg is negative. No drift is seen.  Reflexes: Deep tendon reflexes are symmetric.   Assessment/Plan:  1. Cerebrovascular disease  The patient has 50-69% stenosis of the right internal carotid artery and evidence of a subcortical stroke on the right brain. The carotid Doppler study will need to be  followed over time, if progression is noted, a carotid endarterectomy will be recommended. The patient is to contact me if any new issues arise.  Todd Alexanders MD 01/19/2016 2:39 PM  [facility] [address] [place], [place] [address]  Phone [phone] Fax [phone]

## 2016-02-04 DIAGNOSIS — Z8673 Personal history of transient ischemic attack (TIA), and cerebral infarction without residual deficits: Secondary | ICD-10-CM | POA: Diagnosis not present

## 2016-02-04 DIAGNOSIS — R6889 Other general symptoms and signs: Secondary | ICD-10-CM | POA: Diagnosis not present

## 2016-02-04 DIAGNOSIS — R531 Weakness: Secondary | ICD-10-CM | POA: Diagnosis not present

## 2016-02-04 DIAGNOSIS — A419 Sepsis, unspecified organism: Secondary | ICD-10-CM | POA: Diagnosis not present

## 2016-02-04 DIAGNOSIS — E860 Dehydration: Secondary | ICD-10-CM | POA: Diagnosis not present

## 2016-02-04 DIAGNOSIS — M6281 Muscle weakness (generalized): Secondary | ICD-10-CM | POA: Diagnosis not present

## 2016-02-04 DIAGNOSIS — S0990XA Unspecified injury of head, initial encounter: Secondary | ICD-10-CM | POA: Diagnosis not present

## 2016-02-04 DIAGNOSIS — I10 Essential (primary) hypertension: Secondary | ICD-10-CM | POA: Diagnosis not present

## 2016-02-04 DIAGNOSIS — Z9181 History of falling: Secondary | ICD-10-CM | POA: Diagnosis not present

## 2016-02-04 DIAGNOSIS — Z79899 Other long term (current) drug therapy: Secondary | ICD-10-CM | POA: Diagnosis not present

## 2016-02-04 DIAGNOSIS — Z794 Long term (current) use of insulin: Secondary | ICD-10-CM | POA: Diagnosis not present

## 2016-02-05 DIAGNOSIS — E860 Dehydration: Secondary | ICD-10-CM | POA: Diagnosis not present

## 2016-02-06 DIAGNOSIS — E860 Dehydration: Secondary | ICD-10-CM | POA: Diagnosis not present

## 2016-02-10 DIAGNOSIS — E032 Hypothyroidism due to medicaments and other exogenous substances: Secondary | ICD-10-CM | POA: Diagnosis not present

## 2016-02-10 DIAGNOSIS — E118 Type 2 diabetes mellitus with unspecified complications: Secondary | ICD-10-CM | POA: Diagnosis not present

## 2016-02-17 DIAGNOSIS — I10 Essential (primary) hypertension: Secondary | ICD-10-CM | POA: Diagnosis not present

## 2016-02-17 DIAGNOSIS — E118 Type 2 diabetes mellitus with unspecified complications: Secondary | ICD-10-CM | POA: Diagnosis not present

## 2016-02-17 DIAGNOSIS — E789 Disorder of lipoprotein metabolism, unspecified: Secondary | ICD-10-CM | POA: Diagnosis not present

## 2016-02-21 DIAGNOSIS — E118 Type 2 diabetes mellitus with unspecified complications: Secondary | ICD-10-CM | POA: Diagnosis not present

## 2016-02-21 DIAGNOSIS — I639 Cerebral infarction, unspecified: Secondary | ICD-10-CM | POA: Diagnosis not present

## 2016-02-21 DIAGNOSIS — I129 Hypertensive chronic kidney disease with stage 1 through stage 4 chronic kidney disease, or unspecified chronic kidney disease: Secondary | ICD-10-CM | POA: Diagnosis not present

## 2016-02-21 DIAGNOSIS — D631 Anemia in chronic kidney disease: Secondary | ICD-10-CM | POA: Diagnosis not present

## 2016-02-21 DIAGNOSIS — I739 Peripheral vascular disease, unspecified: Secondary | ICD-10-CM | POA: Diagnosis not present

## 2016-02-21 DIAGNOSIS — Z0000 Encounter for general adult medical examination without abnormal findings: Secondary | ICD-10-CM | POA: Diagnosis not present

## 2016-02-21 DIAGNOSIS — E1129 Type 2 diabetes mellitus with other diabetic kidney complication: Secondary | ICD-10-CM | POA: Diagnosis not present

## 2016-02-21 DIAGNOSIS — N2581 Secondary hyperparathyroidism of renal origin: Secondary | ICD-10-CM | POA: Diagnosis not present

## 2016-02-21 DIAGNOSIS — N2589 Other disorders resulting from impaired renal tubular function: Secondary | ICD-10-CM | POA: Diagnosis not present

## 2016-02-21 DIAGNOSIS — N184 Chronic kidney disease, stage 4 (severe): Secondary | ICD-10-CM | POA: Diagnosis not present

## 2016-02-21 DIAGNOSIS — E782 Mixed hyperlipidemia: Secondary | ICD-10-CM | POA: Diagnosis not present

## 2016-02-21 DIAGNOSIS — D472 Monoclonal gammopathy: Secondary | ICD-10-CM | POA: Diagnosis not present

## 2016-02-23 DIAGNOSIS — M6281 Muscle weakness (generalized): Secondary | ICD-10-CM | POA: Diagnosis not present

## 2016-02-23 DIAGNOSIS — Z794 Long term (current) use of insulin: Secondary | ICD-10-CM | POA: Diagnosis not present

## 2016-02-23 DIAGNOSIS — I10 Essential (primary) hypertension: Secondary | ICD-10-CM | POA: Diagnosis not present

## 2016-02-23 DIAGNOSIS — E119 Type 2 diabetes mellitus without complications: Secondary | ICD-10-CM | POA: Diagnosis not present

## 2016-02-23 DIAGNOSIS — Z7982 Long term (current) use of aspirin: Secondary | ICD-10-CM | POA: Diagnosis not present

## 2016-02-23 DIAGNOSIS — Z9181 History of falling: Secondary | ICD-10-CM | POA: Diagnosis not present

## 2016-02-23 DIAGNOSIS — R260 Ataxic gait: Secondary | ICD-10-CM | POA: Diagnosis not present

## 2016-03-01 DIAGNOSIS — R260 Ataxic gait: Secondary | ICD-10-CM | POA: Diagnosis not present

## 2016-03-01 DIAGNOSIS — Z794 Long term (current) use of insulin: Secondary | ICD-10-CM | POA: Diagnosis not present

## 2016-03-01 DIAGNOSIS — I10 Essential (primary) hypertension: Secondary | ICD-10-CM | POA: Diagnosis not present

## 2016-03-01 DIAGNOSIS — Z7982 Long term (current) use of aspirin: Secondary | ICD-10-CM | POA: Diagnosis not present

## 2016-03-01 DIAGNOSIS — E119 Type 2 diabetes mellitus without complications: Secondary | ICD-10-CM | POA: Diagnosis not present

## 2016-03-01 DIAGNOSIS — M6281 Muscle weakness (generalized): Secondary | ICD-10-CM | POA: Diagnosis not present

## 2016-03-01 DIAGNOSIS — N184 Chronic kidney disease, stage 4 (severe): Secondary | ICD-10-CM | POA: Diagnosis not present

## 2016-03-03 DIAGNOSIS — I10 Essential (primary) hypertension: Secondary | ICD-10-CM | POA: Diagnosis not present

## 2016-03-03 DIAGNOSIS — M6281 Muscle weakness (generalized): Secondary | ICD-10-CM | POA: Diagnosis not present

## 2016-03-03 DIAGNOSIS — E119 Type 2 diabetes mellitus without complications: Secondary | ICD-10-CM | POA: Diagnosis not present

## 2016-03-03 DIAGNOSIS — Z7982 Long term (current) use of aspirin: Secondary | ICD-10-CM | POA: Diagnosis not present

## 2016-03-03 DIAGNOSIS — R260 Ataxic gait: Secondary | ICD-10-CM | POA: Diagnosis not present

## 2016-03-03 DIAGNOSIS — Z794 Long term (current) use of insulin: Secondary | ICD-10-CM | POA: Diagnosis not present

## 2016-03-06 DIAGNOSIS — Z794 Long term (current) use of insulin: Secondary | ICD-10-CM | POA: Diagnosis not present

## 2016-03-06 DIAGNOSIS — M6281 Muscle weakness (generalized): Secondary | ICD-10-CM | POA: Diagnosis not present

## 2016-03-06 DIAGNOSIS — Z7982 Long term (current) use of aspirin: Secondary | ICD-10-CM | POA: Diagnosis not present

## 2016-03-06 DIAGNOSIS — I10 Essential (primary) hypertension: Secondary | ICD-10-CM | POA: Diagnosis not present

## 2016-03-06 DIAGNOSIS — R260 Ataxic gait: Secondary | ICD-10-CM | POA: Diagnosis not present

## 2016-03-06 DIAGNOSIS — E119 Type 2 diabetes mellitus without complications: Secondary | ICD-10-CM | POA: Diagnosis not present

## 2016-03-09 DIAGNOSIS — E119 Type 2 diabetes mellitus without complications: Secondary | ICD-10-CM | POA: Diagnosis not present

## 2016-03-09 DIAGNOSIS — M6281 Muscle weakness (generalized): Secondary | ICD-10-CM | POA: Diagnosis not present

## 2016-03-09 DIAGNOSIS — Z794 Long term (current) use of insulin: Secondary | ICD-10-CM | POA: Diagnosis not present

## 2016-03-09 DIAGNOSIS — Z7982 Long term (current) use of aspirin: Secondary | ICD-10-CM | POA: Diagnosis not present

## 2016-03-09 DIAGNOSIS — R260 Ataxic gait: Secondary | ICD-10-CM | POA: Diagnosis not present

## 2016-03-09 DIAGNOSIS — I10 Essential (primary) hypertension: Secondary | ICD-10-CM | POA: Diagnosis not present

## 2016-03-14 DIAGNOSIS — Z794 Long term (current) use of insulin: Secondary | ICD-10-CM | POA: Diagnosis not present

## 2016-03-14 DIAGNOSIS — M6281 Muscle weakness (generalized): Secondary | ICD-10-CM | POA: Diagnosis not present

## 2016-03-14 DIAGNOSIS — E119 Type 2 diabetes mellitus without complications: Secondary | ICD-10-CM | POA: Diagnosis not present

## 2016-03-14 DIAGNOSIS — I10 Essential (primary) hypertension: Secondary | ICD-10-CM | POA: Diagnosis not present

## 2016-03-14 DIAGNOSIS — Z7982 Long term (current) use of aspirin: Secondary | ICD-10-CM | POA: Diagnosis not present

## 2016-03-14 DIAGNOSIS — R260 Ataxic gait: Secondary | ICD-10-CM | POA: Diagnosis not present

## 2016-03-30 DIAGNOSIS — Z7982 Long term (current) use of aspirin: Secondary | ICD-10-CM | POA: Diagnosis not present

## 2016-03-30 DIAGNOSIS — Z794 Long term (current) use of insulin: Secondary | ICD-10-CM | POA: Diagnosis not present

## 2016-03-30 DIAGNOSIS — M6281 Muscle weakness (generalized): Secondary | ICD-10-CM | POA: Diagnosis not present

## 2016-03-30 DIAGNOSIS — E119 Type 2 diabetes mellitus without complications: Secondary | ICD-10-CM | POA: Diagnosis not present

## 2016-03-30 DIAGNOSIS — I10 Essential (primary) hypertension: Secondary | ICD-10-CM | POA: Diagnosis not present

## 2016-03-30 DIAGNOSIS — R260 Ataxic gait: Secondary | ICD-10-CM | POA: Diagnosis not present

## 2016-05-16 DIAGNOSIS — G629 Polyneuropathy, unspecified: Secondary | ICD-10-CM | POA: Diagnosis not present

## 2016-05-16 DIAGNOSIS — E118 Type 2 diabetes mellitus with unspecified complications: Secondary | ICD-10-CM | POA: Diagnosis not present

## 2016-08-02 DIAGNOSIS — G629 Polyneuropathy, unspecified: Secondary | ICD-10-CM | POA: Diagnosis not present

## 2016-08-02 DIAGNOSIS — E782 Mixed hyperlipidemia: Secondary | ICD-10-CM | POA: Diagnosis not present

## 2016-08-02 DIAGNOSIS — N2589 Other disorders resulting from impaired renal tubular function: Secondary | ICD-10-CM | POA: Diagnosis not present

## 2016-08-02 DIAGNOSIS — E039 Hypothyroidism, unspecified: Secondary | ICD-10-CM | POA: Diagnosis not present

## 2016-08-02 DIAGNOSIS — N2581 Secondary hyperparathyroidism of renal origin: Secondary | ICD-10-CM | POA: Diagnosis not present

## 2016-08-02 DIAGNOSIS — D649 Anemia, unspecified: Secondary | ICD-10-CM | POA: Diagnosis not present

## 2016-08-02 DIAGNOSIS — I619 Nontraumatic intracerebral hemorrhage, unspecified: Secondary | ICD-10-CM | POA: Diagnosis not present

## 2016-08-02 DIAGNOSIS — E118 Type 2 diabetes mellitus with unspecified complications: Secondary | ICD-10-CM | POA: Diagnosis not present

## 2016-08-02 DIAGNOSIS — I10 Essential (primary) hypertension: Secondary | ICD-10-CM | POA: Diagnosis not present

## 2016-08-02 DIAGNOSIS — N184 Chronic kidney disease, stage 4 (severe): Secondary | ICD-10-CM | POA: Diagnosis not present

## 2016-08-02 DIAGNOSIS — Z8589 Personal history of malignant neoplasm of other organs and systems: Secondary | ICD-10-CM | POA: Diagnosis not present

## 2016-08-02 DIAGNOSIS — D472 Monoclonal gammopathy: Secondary | ICD-10-CM | POA: Diagnosis not present

## 2016-08-17 DIAGNOSIS — E538 Deficiency of other specified B group vitamins: Secondary | ICD-10-CM | POA: Diagnosis not present

## 2016-08-17 DIAGNOSIS — G629 Polyneuropathy, unspecified: Secondary | ICD-10-CM | POA: Diagnosis not present

## 2016-08-17 DIAGNOSIS — E118 Type 2 diabetes mellitus with unspecified complications: Secondary | ICD-10-CM | POA: Diagnosis not present

## 2016-08-17 DIAGNOSIS — K3184 Gastroparesis: Secondary | ICD-10-CM | POA: Diagnosis not present

## 2016-08-17 DIAGNOSIS — E032 Hypothyroidism due to medicaments and other exogenous substances: Secondary | ICD-10-CM | POA: Diagnosis not present

## 2016-10-17 DIAGNOSIS — I10 Essential (primary) hypertension: Secondary | ICD-10-CM | POA: Diagnosis not present

## 2016-10-17 DIAGNOSIS — E118 Type 2 diabetes mellitus with unspecified complications: Secondary | ICD-10-CM | POA: Diagnosis not present

## 2016-10-24 DIAGNOSIS — I10 Essential (primary) hypertension: Secondary | ICD-10-CM | POA: Diagnosis not present

## 2016-10-24 DIAGNOSIS — E118 Type 2 diabetes mellitus with unspecified complications: Secondary | ICD-10-CM | POA: Diagnosis not present

## 2016-10-31 DIAGNOSIS — I10 Essential (primary) hypertension: Secondary | ICD-10-CM | POA: Diagnosis not present

## 2016-10-31 DIAGNOSIS — E118 Type 2 diabetes mellitus with unspecified complications: Secondary | ICD-10-CM | POA: Diagnosis not present

## 2016-11-09 DIAGNOSIS — E118 Type 2 diabetes mellitus with unspecified complications: Secondary | ICD-10-CM | POA: Diagnosis not present

## 2016-11-09 DIAGNOSIS — I6521 Occlusion and stenosis of right carotid artery: Secondary | ICD-10-CM | POA: Diagnosis not present

## 2016-11-09 DIAGNOSIS — I6523 Occlusion and stenosis of bilateral carotid arteries: Secondary | ICD-10-CM | POA: Diagnosis not present

## 2016-11-14 DIAGNOSIS — E538 Deficiency of other specified B group vitamins: Secondary | ICD-10-CM | POA: Diagnosis not present

## 2016-11-14 DIAGNOSIS — Z125 Encounter for screening for malignant neoplasm of prostate: Secondary | ICD-10-CM | POA: Diagnosis not present

## 2016-11-14 DIAGNOSIS — I10 Essential (primary) hypertension: Secondary | ICD-10-CM | POA: Diagnosis not present

## 2016-11-14 DIAGNOSIS — E118 Type 2 diabetes mellitus with unspecified complications: Secondary | ICD-10-CM | POA: Diagnosis not present

## 2016-11-18 ENCOUNTER — Telehealth: Payer: Self-pay | Admitting: Neurology

## 2016-11-18 NOTE — Telephone Encounter (Signed)
I called and left a message. Follow up carotid doppler is recommended, if they are amenable to this, they are to call and I will schedule.

## 2016-11-20 DIAGNOSIS — E7800 Pure hypercholesterolemia, unspecified: Secondary | ICD-10-CM | POA: Diagnosis not present

## 2016-11-20 DIAGNOSIS — N183 Chronic kidney disease, stage 3 (moderate): Secondary | ICD-10-CM | POA: Diagnosis not present

## 2016-11-20 DIAGNOSIS — Z23 Encounter for immunization: Secondary | ICD-10-CM | POA: Diagnosis not present

## 2016-11-20 DIAGNOSIS — I2510 Atherosclerotic heart disease of native coronary artery without angina pectoris: Secondary | ICD-10-CM | POA: Diagnosis not present

## 2016-11-20 DIAGNOSIS — I6523 Occlusion and stenosis of bilateral carotid arteries: Secondary | ICD-10-CM | POA: Diagnosis not present

## 2016-11-20 DIAGNOSIS — E118 Type 2 diabetes mellitus with unspecified complications: Secondary | ICD-10-CM | POA: Diagnosis not present

## 2016-11-22 ENCOUNTER — Other Ambulatory Visit: Payer: Self-pay

## 2016-11-22 DIAGNOSIS — I6523 Occlusion and stenosis of bilateral carotid arteries: Secondary | ICD-10-CM

## 2016-12-01 DIAGNOSIS — I639 Cerebral infarction, unspecified: Secondary | ICD-10-CM | POA: Diagnosis not present

## 2016-12-01 DIAGNOSIS — I129 Hypertensive chronic kidney disease with stage 1 through stage 4 chronic kidney disease, or unspecified chronic kidney disease: Secondary | ICD-10-CM | POA: Diagnosis not present

## 2016-12-01 DIAGNOSIS — Z723 Lack of physical exercise: Secondary | ICD-10-CM | POA: Diagnosis not present

## 2016-12-01 DIAGNOSIS — N184 Chronic kidney disease, stage 4 (severe): Secondary | ICD-10-CM | POA: Diagnosis not present

## 2016-12-01 DIAGNOSIS — E118 Type 2 diabetes mellitus with unspecified complications: Secondary | ICD-10-CM | POA: Diagnosis not present

## 2016-12-01 DIAGNOSIS — D631 Anemia in chronic kidney disease: Secondary | ICD-10-CM | POA: Diagnosis not present

## 2016-12-01 DIAGNOSIS — N2581 Secondary hyperparathyroidism of renal origin: Secondary | ICD-10-CM | POA: Diagnosis not present

## 2016-12-01 DIAGNOSIS — G629 Polyneuropathy, unspecified: Secondary | ICD-10-CM | POA: Diagnosis not present

## 2016-12-01 DIAGNOSIS — E782 Mixed hyperlipidemia: Secondary | ICD-10-CM | POA: Diagnosis not present

## 2016-12-01 DIAGNOSIS — I739 Peripheral vascular disease, unspecified: Secondary | ICD-10-CM | POA: Diagnosis not present

## 2016-12-01 DIAGNOSIS — N2589 Other disorders resulting from impaired renal tubular function: Secondary | ICD-10-CM | POA: Diagnosis not present

## 2016-12-01 DIAGNOSIS — E1122 Type 2 diabetes mellitus with diabetic chronic kidney disease: Secondary | ICD-10-CM | POA: Diagnosis not present

## 2016-12-29 ENCOUNTER — Encounter: Payer: Self-pay | Admitting: Vascular Surgery

## 2016-12-29 ENCOUNTER — Ambulatory Visit (INDEPENDENT_AMBULATORY_CARE_PROVIDER_SITE_OTHER): Payer: Medicare Other | Admitting: Vascular Surgery

## 2016-12-29 ENCOUNTER — Ambulatory Visit (HOSPITAL_COMMUNITY)
Admission: RE | Admit: 2016-12-29 | Discharge: 2016-12-29 | Disposition: A | Discharge status: Home / Self Care | Payer: Medicare Other | Source: Ambulatory Visit | Attending: Vascular Surgery | Admitting: Vascular Surgery

## 2016-12-29 VITALS — BP 140/83 | HR 75 | Temp 97.3°F | Resp 20 | Ht 67.5 in | Wt 163.5 lb

## 2016-12-29 DIAGNOSIS — I6523 Occlusion and stenosis of bilateral carotid arteries: Secondary | ICD-10-CM | POA: Insufficient documentation

## 2016-12-29 LAB — VAS US CAROTID
LCCADDIAS: 22 cm/s
LCCADSYS: 76 cm/s
LCCAPDIAS: 21 cm/s
LCCAPSYS: 83 cm/s
LEFT ECA DIAS: -27 cm/s
LICADDIAS: -20 cm/s
LICADSYS: -75 cm/s
LICAPDIAS: 37 cm/s
LICAPSYS: 126 cm/s
RCCAPSYS: 121 cm/s
RIGHT CCA MID DIAS: 20 cm/s
RIGHT ECA DIAS: -56 cm/s
Right CCA prox dias: 22 cm/s
Right cca dist sys: -134 cm/s

## 2016-12-29 NOTE — Progress Notes (Signed)
Patient ID: Todd Hall, male   DOB: 08/13/1945, 71 y.o.   MRN: [id number]  Reason for Consult: New Patient (Initial Visit) (eval carotid stenois - ref by Dr. Jani Gravel)   Referred by Anda Kraft, MD  Subjective:     HPI:  Todd Hall is a 71 y.o. male with history of diabetes and had a left-sided thalamic and centrum semiovale stroke in the right brain with left hemisensory deficit followed by neurology since last year.  He has persistent weakness in his left arm shuffling in his left foot and has had recent falls.  He does not hear well this is unrelated to his stroke.  He does complain of dizziness.  He has not had other symptoms since that one episode.  He does take aspirin daily.  He now presents with carotid duplexes for further evaluation.  Past Medical History:  Diagnosis Date  . Abnormality of gait 08/08/2013  . Angina    h/o cp  . Arthritis    arthritis in neck and spine  . Cancer ([place])    tongue, throat and neck cancer - treated with chemo and radiation 2004  . Carotid artery occlusion   . Cerebrovascular disease 01/19/2016  . Chest pain 09/14/2015  . Chronic kidney disease    "kidney failure" sees dr. Esau Grew - takes med for this - not on HD  . Diabetes mellitus    diagnosed about 15 yrs ago  . Diabetes mellitus type 2 in nonobese ([place]) 09/14/2015  . Diabetic peripheral neuropathy ([place]) 01/19/2016  . Diabetic retinopathy ([place])   . Dysrhythmia    h/o being told heart was too large as a child and that it beat too fast  . Falls 09/14/2015  . GERD (gastroesophageal reflux disease)   . Headache(784.0)    h/o headaches and migraines when younger  . History of syncope   . HOH (hard of hearing)   . Hyperlipidemia   . Hypertension    saw a cardiologist in [place] at least 10 yrs ago.  Had stress test 10 yrs ago.  Marland Kitchen Hypothyroidism    takes synthroid - unsure if problem is hypo or hyper  . Myocardial infarction [facility])    h/o 2 small MIs due to his looks of ekg.  .  Pneumonia    pneumonia as a child  . Polyneuropathy in diabetes(357.2) 08/08/2013  . Shortness of breath 09/14/2015  . Stroke ([place])   . Syncope 09/14/2015  . Throat cancer [facility])    Family History  Problem Relation Age of Onset  . Diabetes Mother   . AAA (abdominal aortic aneurysm) Mother   . Heart Problems Father   . CAD Father   . Heart disease Father   . Heart disease Brother   . Heart Problems Brother   . Heart Problems Brother   . Peripheral Artery Disease Brother   . Diabetes Brother   . Diabetes Brother   . Peripheral Artery Disease Brother    Past Surgical History:  Procedure Laterality Date  . ANTERIOR CERVICAL DECOMP/DISCECTOMY FUSION  12/27/2010   Procedure: ANTERIOR CERVICAL DECOMPRESSION/DISCECTOMY FUSION 1 LEVEL;  Surgeon: [name], MD;  Location: [facility];  Service: Neurosurgery;  Laterality: N/A;  Cervical six-seven anterior cervical decompression fusion with interbody prosthesis and plating  . Laser surgery, retina     Diabetic retinopathy  . NASAL SINUS SURGERY    . port-a-cath placement    . PORT-A-CATH REMOVAL  2005    Short  Social History:  Social History   Tobacco Use  . Smoking status: Former Smoker    Packs/day: 0.25    Years: 20.00    Pack years: 5.00    Types: Cigarettes    Last attempt to quit: 12/20/1980    Years since quitting: 36.0  . Smokeless tobacco: Never Used  Substance Use Topics  . Alcohol use: No    Comment: when young drank beers on weekend    No Known Allergies  Current Outpatient Medications  Medication Sig Dispense Refill  . aspirin EC 325 MG tablet Take 325 mg by mouth daily.     . calcitRIOL (ROCALTROL) 0.25 MCG capsule Take 1 capsule by mouth daily.    . furosemide (LASIX) 40 MG tablet Take 40 mg by mouth daily.      Marland Kitchen gabapentin (NEURONTIN) 400 MG capsule Take 1 capsule by mouth 2 (two) times daily.    . insulin lispro protamine-insulin lispro (HUMALOG 50/50) (50-50) 100 UNIT/ML SUSP Inject 22-24 Units into  the skin 2 (two) times daily before a meal. 24 units in the a.m. And 22 units in the p.m.     . LANTUS SOLOSTAR 100 UNIT/ML Solostar Pen     . omeprazole (PRILOSEC) 20 MG capsule Take 20 mg by mouth daily.      . sodium bicarbonate 650 MG tablet Take 650 mg by mouth 2 (two) times daily.      . TRULICITY 1.5 AS/5.0NL SOPN     . vitamin B-12 (CYANOCOBALAMIN) 1000 MCG tablet Take 1,000 mcg by mouth daily.    Marland Kitchen HYDROcodone-acetaminophen (VICODIN) 5-500 MG per tablet Take 1 tablet by mouth every 6 (six) hours as needed. For pain     No current facility-administered medications for this visit.     Review of Systems  HENT: HENT negative.  Eyes: Eyes negative.  Respiratory: Positive for shortness of breath.  Cardiovascular: Positive for dyspnea with exertion.  GI: Gastrointestinal negative.  Musculoskeletal: Positive for leg pain.  Skin: Skin negative.  Neurological: Positive for dizziness, focal weakness and numbness.  Hematologic: Hematologic/lymphatic negative.  Psychiatric: Psychiatric negative.        Objective:  Objective   Vitals:   12/29/16 1131 12/29/16 1132  BP: 135/80 140/83  Pulse: 75   Resp: 20   Temp: (!) 97.3 F (36.3 C)   TempSrc: Oral   SpO2: 98%   Weight: 163 lb 8 oz (74.2 kg)   Height: 5' 7.5" (1.715 m)    Body mass index is 25.23 kg/m.  Physical Exam  Constitutional: He is oriented to person, place, and time. He appears well-developed.  HENT:  Head: Normocephalic.  Neck: Normal range of motion.  Cardiovascular: Normal rate.  Pulses:      Radial pulses are 2+ on the right side, and 2+ on the left side.       Femoral pulses are 2+ on the right side, and 2+ on the left side.      Dorsalis pedis pulses are 2+ on the right side, and 2+ on the left side.  Pulmonary/Chest: Effort normal.  Abdominal: Soft.  Musculoskeletal: Normal range of motion. He exhibits no edema.  Neurological: He is alert and oriented to person, place, and time.  Skin: Skin is warm  and dry.  Psychiatric: He has a normal mood and affect. His behavior is normal. Judgment and thought content normal.    Data: I have independently interpreted his bilateral carotid duplex is to have 40-59% stenosis bilaterally.  Vertebral  flow on the right is retrograde.  Peak systolic velocity on the right 141 on the left 126.     Assessment/Plan:    71 year old male history of a stroke approximately 1 year ago residual left arm left leg weakness.  He has 40-59% stenosis bilaterally in his carotids on aspirin.  We will follow this yearly.  He will continue to follow with neurology.  I discussed signs and symptoms of stroke which there is aware of that would need emergent evaluation otherwise we will see him as time next year.  Waynetta Sandy MD [facility]

## 2017-01-15 DIAGNOSIS — E032 Hypothyroidism due to medicaments and other exogenous substances: Secondary | ICD-10-CM | POA: Diagnosis not present

## 2017-01-15 DIAGNOSIS — I10 Essential (primary) hypertension: Secondary | ICD-10-CM | POA: Diagnosis not present

## 2017-01-15 DIAGNOSIS — E118 Type 2 diabetes mellitus with unspecified complications: Secondary | ICD-10-CM | POA: Diagnosis not present

## 2017-01-17 DIAGNOSIS — I10 Essential (primary) hypertension: Secondary | ICD-10-CM | POA: Diagnosis not present

## 2017-01-17 DIAGNOSIS — E1121 Type 2 diabetes mellitus with diabetic nephropathy: Secondary | ICD-10-CM | POA: Diagnosis not present

## 2017-03-27 DIAGNOSIS — H353131 Nonexudative age-related macular degeneration, bilateral, early dry stage: Secondary | ICD-10-CM | POA: Diagnosis not present

## 2017-03-27 DIAGNOSIS — E113592 Type 2 diabetes mellitus with proliferative diabetic retinopathy without macular edema, left eye: Secondary | ICD-10-CM | POA: Diagnosis not present

## 2017-03-27 DIAGNOSIS — H35043 Retinal micro-aneurysms, unspecified, bilateral: Secondary | ICD-10-CM | POA: Diagnosis not present

## 2017-03-27 DIAGNOSIS — E113591 Type 2 diabetes mellitus with proliferative diabetic retinopathy without macular edema, right eye: Secondary | ICD-10-CM | POA: Diagnosis not present

## 2017-04-04 DIAGNOSIS — N2581 Secondary hyperparathyroidism of renal origin: Secondary | ICD-10-CM | POA: Diagnosis not present

## 2017-04-04 DIAGNOSIS — N184 Chronic kidney disease, stage 4 (severe): Secondary | ICD-10-CM | POA: Diagnosis not present

## 2017-04-04 DIAGNOSIS — I129 Hypertensive chronic kidney disease with stage 1 through stage 4 chronic kidney disease, or unspecified chronic kidney disease: Secondary | ICD-10-CM | POA: Diagnosis not present

## 2017-04-04 DIAGNOSIS — D631 Anemia in chronic kidney disease: Secondary | ICD-10-CM | POA: Diagnosis not present

## 2017-04-04 DIAGNOSIS — E1122 Type 2 diabetes mellitus with diabetic chronic kidney disease: Secondary | ICD-10-CM | POA: Diagnosis not present

## 2017-04-04 DIAGNOSIS — N2589 Other disorders resulting from impaired renal tubular function: Secondary | ICD-10-CM | POA: Diagnosis not present

## 2017-04-05 DIAGNOSIS — I10 Essential (primary) hypertension: Secondary | ICD-10-CM | POA: Diagnosis not present

## 2017-04-05 DIAGNOSIS — E113592 Type 2 diabetes mellitus with proliferative diabetic retinopathy without macular edema, left eye: Secondary | ICD-10-CM | POA: Diagnosis not present

## 2017-04-05 DIAGNOSIS — E032 Hypothyroidism due to medicaments and other exogenous substances: Secondary | ICD-10-CM | POA: Diagnosis not present

## 2017-04-05 DIAGNOSIS — H353131 Nonexudative age-related macular degeneration, bilateral, early dry stage: Secondary | ICD-10-CM | POA: Diagnosis not present

## 2017-04-05 DIAGNOSIS — E113591 Type 2 diabetes mellitus with proliferative diabetic retinopathy without macular edema, right eye: Secondary | ICD-10-CM | POA: Diagnosis not present

## 2017-04-05 DIAGNOSIS — E118 Type 2 diabetes mellitus with unspecified complications: Secondary | ICD-10-CM | POA: Diagnosis not present

## 2017-04-10 DIAGNOSIS — E113592 Type 2 diabetes mellitus with proliferative diabetic retinopathy without macular edema, left eye: Secondary | ICD-10-CM | POA: Diagnosis not present

## 2017-04-12 DIAGNOSIS — E032 Hypothyroidism due to medicaments and other exogenous substances: Secondary | ICD-10-CM | POA: Diagnosis not present

## 2017-04-12 DIAGNOSIS — E538 Deficiency of other specified B group vitamins: Secondary | ICD-10-CM | POA: Diagnosis not present

## 2017-04-12 DIAGNOSIS — I10 Essential (primary) hypertension: Secondary | ICD-10-CM | POA: Diagnosis not present

## 2017-04-12 DIAGNOSIS — Z125 Encounter for screening for malignant neoplasm of prostate: Secondary | ICD-10-CM | POA: Diagnosis not present

## 2017-04-12 DIAGNOSIS — E118 Type 2 diabetes mellitus with unspecified complications: Secondary | ICD-10-CM | POA: Diagnosis not present

## 2017-04-30 DIAGNOSIS — H35371 Puckering of macula, right eye: Secondary | ICD-10-CM | POA: Diagnosis not present

## 2017-04-30 DIAGNOSIS — E113592 Type 2 diabetes mellitus with proliferative diabetic retinopathy without macular edema, left eye: Secondary | ICD-10-CM | POA: Diagnosis not present

## 2017-04-30 DIAGNOSIS — H353131 Nonexudative age-related macular degeneration, bilateral, early dry stage: Secondary | ICD-10-CM | POA: Diagnosis not present

## 2017-04-30 DIAGNOSIS — E113591 Type 2 diabetes mellitus with proliferative diabetic retinopathy without macular edema, right eye: Secondary | ICD-10-CM | POA: Diagnosis not present

## 2017-07-12 DIAGNOSIS — I10 Essential (primary) hypertension: Secondary | ICD-10-CM | POA: Diagnosis not present

## 2017-07-12 DIAGNOSIS — E7800 Pure hypercholesterolemia, unspecified: Secondary | ICD-10-CM | POA: Diagnosis not present

## 2017-07-12 DIAGNOSIS — E118 Type 2 diabetes mellitus with unspecified complications: Secondary | ICD-10-CM | POA: Diagnosis not present

## 2017-07-24 DIAGNOSIS — R079 Chest pain, unspecified: Secondary | ICD-10-CM | POA: Diagnosis not present

## 2017-07-24 DIAGNOSIS — D649 Anemia, unspecified: Secondary | ICD-10-CM | POA: Diagnosis not present

## 2017-07-24 DIAGNOSIS — Z6825 Body mass index (BMI) 25.0-25.9, adult: Secondary | ICD-10-CM | POA: Diagnosis not present

## 2017-07-24 DIAGNOSIS — R0789 Other chest pain: Secondary | ICD-10-CM | POA: Diagnosis not present

## 2017-07-24 DIAGNOSIS — Z8673 Personal history of transient ischemic attack (TIA), and cerebral infarction without residual deficits: Secondary | ICD-10-CM | POA: Diagnosis not present

## 2017-07-24 DIAGNOSIS — I214 Non-ST elevation (NSTEMI) myocardial infarction: Secondary | ICD-10-CM | POA: Diagnosis not present

## 2017-07-24 DIAGNOSIS — Z87891 Personal history of nicotine dependence: Secondary | ICD-10-CM | POA: Diagnosis not present

## 2017-07-24 DIAGNOSIS — I25118 Atherosclerotic heart disease of native coronary artery with other forms of angina pectoris: Secondary | ICD-10-CM | POA: Diagnosis not present

## 2017-07-24 DIAGNOSIS — Z7982 Long term (current) use of aspirin: Secondary | ICD-10-CM | POA: Diagnosis not present

## 2017-07-24 DIAGNOSIS — E1165 Type 2 diabetes mellitus with hyperglycemia: Secondary | ICD-10-CM | POA: Diagnosis present

## 2017-07-24 DIAGNOSIS — N183 Chronic kidney disease, stage 3 unspecified: Secondary | ICD-10-CM | POA: Insufficient documentation

## 2017-07-24 DIAGNOSIS — Z955 Presence of coronary angioplasty implant and graft: Secondary | ICD-10-CM | POA: Diagnosis not present

## 2017-07-24 DIAGNOSIS — F17211 Nicotine dependence, cigarettes, in remission: Secondary | ICD-10-CM | POA: Diagnosis not present

## 2017-07-24 DIAGNOSIS — N189 Chronic kidney disease, unspecified: Secondary | ICD-10-CM | POA: Diagnosis not present

## 2017-07-24 DIAGNOSIS — I25119 Atherosclerotic heart disease of native coronary artery with unspecified angina pectoris: Secondary | ICD-10-CM | POA: Diagnosis present

## 2017-07-24 DIAGNOSIS — Z794 Long term (current) use of insulin: Secondary | ICD-10-CM | POA: Diagnosis not present

## 2017-07-24 DIAGNOSIS — N179 Acute kidney failure, unspecified: Secondary | ICD-10-CM | POA: Diagnosis present

## 2017-07-24 DIAGNOSIS — R072 Precordial pain: Secondary | ICD-10-CM | POA: Diagnosis not present

## 2017-07-24 DIAGNOSIS — E1122 Type 2 diabetes mellitus with diabetic chronic kidney disease: Secondary | ICD-10-CM | POA: Diagnosis present

## 2017-07-24 DIAGNOSIS — R9431 Abnormal electrocardiogram [ECG] [EKG]: Secondary | ICD-10-CM | POA: Diagnosis not present

## 2017-07-24 DIAGNOSIS — I129 Hypertensive chronic kidney disease with stage 1 through stage 4 chronic kidney disease, or unspecified chronic kidney disease: Secondary | ICD-10-CM | POA: Diagnosis not present

## 2017-07-24 DIAGNOSIS — I252 Old myocardial infarction: Secondary | ICD-10-CM | POA: Diagnosis not present

## 2017-07-24 DIAGNOSIS — Z79899 Other long term (current) drug therapy: Secondary | ICD-10-CM | POA: Diagnosis not present

## 2017-07-24 DIAGNOSIS — I1310 Hypertensive heart and chronic kidney disease without heart failure, with stage 1 through stage 4 chronic kidney disease, or unspecified chronic kidney disease: Secondary | ICD-10-CM | POA: Diagnosis not present

## 2017-07-24 DIAGNOSIS — I10 Essential (primary) hypertension: Secondary | ICD-10-CM | POA: Insufficient documentation

## 2017-07-24 DIAGNOSIS — I2510 Atherosclerotic heart disease of native coronary artery without angina pectoris: Secondary | ICD-10-CM | POA: Diagnosis not present

## 2017-07-27 MED ORDER — HYDRALAZINE HCL 20 MG/ML IJ SOLN
10.00 | INTRAMUSCULAR | Status: DC
Start: ? — End: 2017-07-27

## 2017-07-27 MED ORDER — ISOSORBIDE MONONITRATE ER 30 MG PO TB24
30.00 | ORAL_TABLET | ORAL | Status: DC
Start: 2017-07-28 — End: 2017-07-27

## 2017-07-27 MED ORDER — GABAPENTIN 400 MG PO CAPS
400.00 | ORAL_CAPSULE | ORAL | Status: DC
Start: 2017-07-27 — End: 2017-07-27

## 2017-07-27 MED ORDER — RANOLAZINE ER 500 MG PO TB12
500.00 | ORAL_TABLET | ORAL | Status: DC
Start: 2017-07-27 — End: 2017-07-27

## 2017-07-27 MED ORDER — MORPHINE SULFATE 4 MG/ML IJ SOLN
2.00 | INTRAMUSCULAR | Status: DC
Start: ? — End: 2017-07-27

## 2017-07-27 MED ORDER — ONDANSETRON HCL 4 MG/2ML IJ SOLN
4.00 | INTRAMUSCULAR | Status: DC
Start: ? — End: 2017-07-27

## 2017-07-27 MED ORDER — ACETAMINOPHEN 650 MG RE SUPP
650.00 | RECTAL | Status: DC
Start: ? — End: 2017-07-27

## 2017-07-27 MED ORDER — ASPIRIN 81 MG PO CHEW
81.00 | CHEWABLE_TABLET | ORAL | Status: DC
Start: ? — End: 2017-07-27

## 2017-07-27 MED ORDER — GLUCOSE 40 % PO GEL
15.00 g | ORAL | Status: DC
Start: ? — End: 2017-07-27

## 2017-07-27 MED ORDER — VITAMIN B-12 1000 MCG PO TABS
1000.00 | ORAL_TABLET | ORAL | Status: DC
Start: 2017-07-28 — End: 2017-07-27

## 2017-07-27 MED ORDER — ACETYLCYSTEINE 20 % IN SOLN
600.00 | RESPIRATORY_TRACT | Status: DC
Start: 2017-07-27 — End: 2017-07-27

## 2017-07-27 MED ORDER — ATORVASTATIN CALCIUM 40 MG PO TABS
40.00 | ORAL_TABLET | ORAL | Status: DC
Start: 2017-07-27 — End: 2017-07-27

## 2017-07-27 MED ORDER — DOCUSATE SODIUM 100 MG PO CAPS
100.00 | ORAL_CAPSULE | ORAL | Status: DC
Start: ? — End: 2017-07-27

## 2017-07-27 MED ORDER — HEPARIN SODIUM (PORCINE) 5000 UNIT/ML IJ SOLN
5000.00 | INTRAMUSCULAR | Status: DC
Start: 2017-07-27 — End: 2017-07-27

## 2017-07-27 MED ORDER — METOPROLOL TARTRATE 50 MG PO TABS
50.00 | ORAL_TABLET | ORAL | Status: DC
Start: 2017-07-27 — End: 2017-07-27

## 2017-07-27 MED ORDER — DEXTROSE 50 % IV SOLN
12.00 g | INTRAVENOUS | Status: DC
Start: ? — End: 2017-07-27

## 2017-07-27 MED ORDER — INSULIN LISPRO 100 UNIT/ML ~~LOC~~ SOLN
1.00 | SUBCUTANEOUS | Status: DC
Start: 2017-07-27 — End: 2017-07-27

## 2017-07-27 MED ORDER — ZOLPIDEM TARTRATE 5 MG PO TABS
5.00 | ORAL_TABLET | ORAL | Status: DC
Start: ? — End: 2017-07-27

## 2017-07-27 MED ORDER — SODIUM BICARBONATE 650 MG PO TABS
650.00 | ORAL_TABLET | ORAL | Status: DC
Start: 2017-07-27 — End: 2017-07-27

## 2017-07-27 MED ORDER — GENERIC EXTERNAL MEDICATION
1.00 | Status: DC
Start: ? — End: 2017-07-27

## 2017-07-27 MED ORDER — GUAIFENESIN-DM 100-10 MG/5ML PO SYRP
5.00 | ORAL_SOLUTION | ORAL | Status: DC
Start: ? — End: 2017-07-27

## 2017-07-27 MED ORDER — CLOPIDOGREL BISULFATE 75 MG PO TABS
75.00 | ORAL_TABLET | ORAL | Status: DC
Start: 2017-07-28 — End: 2017-07-27

## 2017-08-10 DIAGNOSIS — E039 Hypothyroidism, unspecified: Secondary | ICD-10-CM | POA: Diagnosis not present

## 2017-08-10 DIAGNOSIS — E118 Type 2 diabetes mellitus with unspecified complications: Secondary | ICD-10-CM | POA: Diagnosis not present

## 2017-08-10 DIAGNOSIS — E032 Hypothyroidism due to medicaments and other exogenous substances: Secondary | ICD-10-CM | POA: Diagnosis not present

## 2017-08-10 DIAGNOSIS — I10 Essential (primary) hypertension: Secondary | ICD-10-CM | POA: Diagnosis not present

## 2017-08-10 DIAGNOSIS — I2510 Atherosclerotic heart disease of native coronary artery without angina pectoris: Secondary | ICD-10-CM | POA: Diagnosis not present

## 2017-08-24 DIAGNOSIS — M5090 Cervical disc disorder, unspecified, unspecified cervical region: Secondary | ICD-10-CM | POA: Diagnosis not present

## 2017-08-24 DIAGNOSIS — I639 Cerebral infarction, unspecified: Secondary | ICD-10-CM | POA: Diagnosis not present

## 2017-08-24 DIAGNOSIS — E782 Mixed hyperlipidemia: Secondary | ICD-10-CM | POA: Diagnosis not present

## 2017-08-24 DIAGNOSIS — E118 Type 2 diabetes mellitus with unspecified complications: Secondary | ICD-10-CM | POA: Diagnosis not present

## 2017-08-24 DIAGNOSIS — G629 Polyneuropathy, unspecified: Secondary | ICD-10-CM | POA: Diagnosis not present

## 2017-08-24 DIAGNOSIS — I129 Hypertensive chronic kidney disease with stage 1 through stage 4 chronic kidney disease, or unspecified chronic kidney disease: Secondary | ICD-10-CM | POA: Diagnosis not present

## 2017-08-24 DIAGNOSIS — E1122 Type 2 diabetes mellitus with diabetic chronic kidney disease: Secondary | ICD-10-CM | POA: Diagnosis not present

## 2017-08-24 DIAGNOSIS — N184 Chronic kidney disease, stage 4 (severe): Secondary | ICD-10-CM | POA: Diagnosis not present

## 2017-08-24 DIAGNOSIS — E039 Hypothyroidism, unspecified: Secondary | ICD-10-CM | POA: Diagnosis not present

## 2017-08-24 DIAGNOSIS — N2581 Secondary hyperparathyroidism of renal origin: Secondary | ICD-10-CM | POA: Diagnosis not present

## 2017-08-24 DIAGNOSIS — D631 Anemia in chronic kidney disease: Secondary | ICD-10-CM | POA: Diagnosis not present

## 2017-08-24 DIAGNOSIS — I739 Peripheral vascular disease, unspecified: Secondary | ICD-10-CM | POA: Diagnosis not present

## 2017-08-27 DIAGNOSIS — N183 Chronic kidney disease, stage 3 (moderate): Secondary | ICD-10-CM | POA: Diagnosis not present

## 2017-08-27 DIAGNOSIS — R42 Dizziness and giddiness: Secondary | ICD-10-CM | POA: Diagnosis not present

## 2017-08-27 DIAGNOSIS — I214 Non-ST elevation (NSTEMI) myocardial infarction: Secondary | ICD-10-CM | POA: Diagnosis not present

## 2017-08-27 DIAGNOSIS — I2510 Atherosclerotic heart disease of native coronary artery without angina pectoris: Secondary | ICD-10-CM | POA: Diagnosis not present

## 2017-08-29 DIAGNOSIS — R001 Bradycardia, unspecified: Secondary | ICD-10-CM | POA: Diagnosis not present

## 2017-09-19 ENCOUNTER — Encounter: Payer: Self-pay | Admitting: *Deleted

## 2017-09-20 ENCOUNTER — Encounter: Payer: Medicare Other | Admitting: Cardiothoracic Surgery

## 2017-09-24 ENCOUNTER — Other Ambulatory Visit: Payer: Self-pay

## 2017-09-24 ENCOUNTER — Ambulatory Visit
Admission: RE | Admit: 2017-09-24 | Discharge: 2017-09-24 | Disposition: A | Discharge status: Home / Self Care | Payer: Medicare Other | Source: Ambulatory Visit | Attending: Cardiothoracic Surgery | Admitting: Cardiothoracic Surgery

## 2017-09-24 ENCOUNTER — Other Ambulatory Visit: Payer: Self-pay | Admitting: Cardiothoracic Surgery

## 2017-09-24 ENCOUNTER — Institutional Professional Consult (permissible substitution) (INDEPENDENT_AMBULATORY_CARE_PROVIDER_SITE_OTHER): Payer: Medicare Other | Admitting: Cardiothoracic Surgery

## 2017-09-24 ENCOUNTER — Encounter: Payer: Self-pay | Admitting: Cardiothoracic Surgery

## 2017-09-24 ENCOUNTER — Other Ambulatory Visit: Payer: Self-pay | Admitting: *Deleted

## 2017-09-24 VITALS — BP 114/74 | HR 62 | Resp 20 | Ht 67.5 in | Wt 182.0 lb

## 2017-09-24 DIAGNOSIS — S2241XA Multiple fractures of ribs, right side, initial encounter for closed fracture: Secondary | ICD-10-CM | POA: Diagnosis not present

## 2017-09-24 DIAGNOSIS — W19XXXA Unspecified fall, initial encounter: Secondary | ICD-10-CM

## 2017-09-24 DIAGNOSIS — S2231XA Fracture of one rib, right side, initial encounter for closed fracture: Secondary | ICD-10-CM | POA: Diagnosis not present

## 2017-09-24 DIAGNOSIS — I2510 Atherosclerotic heart disease of native coronary artery without angina pectoris: Secondary | ICD-10-CM

## 2017-09-24 NOTE — Progress Notes (Signed)
PCP is Jani Gravel, MD Referring Provider is Deterding, Guadelupe Sabin,*  Chief Complaint  Patient presents with  . Coronary Artery Disease    Surgical eval, Cardiac Cath and ECHO 07/26/2017, Stress test 07/25/17, last PFT's 10/22/2015   Patient examined, images from coronary arteriogram July 2019 and transthoracic 2D echocardiogram July 2019 personally reviewed and counseled with patient and wife  HPI: 72 year old diabetic reformed smoker with several comorbidities including history of head neck cancer treated with chemoradiation, stage IV chronic kidney disease with renotubular acidosis, history of right thalamic stroke [2017] with left-sided weakness and history of multiple falls as a sequela of the stroke-wife states he has fallen 100s of times. The patient was visiting his son in July of this year at [facility] when he developed a non-STEMI.  He had a cardiac catheterization demonstrating severe three-vessel coronary disease with diffuse diabetic pattern of disease.  The LAD is probably graftable.  The distal dominant right coronary is graftable I and the optional diagonal/ intermediate is possibly graftable.  The circumflex is atretic and not graftable.  The patient's son had diabetes and organ failure and sepsis and subsequently expired.  Since that non-STEMI he has had minimal chest pain or shortness of breath.  Patient's head and neck cancer was treated 14 years ago and is not active problem.  He has bilateral 50% carotid artery stenosis.  Intracranial vascular disease is felt to be responsible for his stroke. Patient states he has fallen 100s of times and most recently has fallen on his chest with bruises and pain on his right greater than left thorax.  His falls are completely without warning. Past Medical History:  Diagnosis Date  . Abnormality of gait 08/08/2013  . Angina    h/o cp  . Arthritis    arthritis in neck and spine  . Cancer ([place])    tongue, throat and neck  cancer - treated with chemo and radiation 2004  . Carotid artery occlusion   . Cerebrovascular disease 01/19/2016  . Chest pain 09/14/2015  . Chronic kidney disease    "kidney failure" sees dr. Esau Grew - takes med for this - not on HD  . Diabetes mellitus    diagnosed about 15 yrs ago  . Diabetes mellitus type 2 in nonobese ([place]) 09/14/2015  . Diabetic peripheral neuropathy ([place]) 01/19/2016  . Diabetic retinopathy ([place])   . Dysrhythmia    h/o being told heart was too large as a child and that it beat too fast  . Falls 09/14/2015  . GERD (gastroesophageal reflux disease)   . Headache(784.0)    h/o headaches and migraines when younger  . History of syncope   . HOH (hard of hearing)   . Hyperlipidemia   . Hypertension    saw a cardiologist in [place] at least 10 yrs ago.  Had stress test 10 yrs ago.  Marland Kitchen Hypothyroidism    takes synthroid - unsure if problem is hypo or hyper  . Myocardial infarction [facility])    h/o 2 small MIs due to his looks of ekg.  . Pneumonia    pneumonia as a child  . Polyneuropathy in diabetes(357.2) 08/08/2013  . Shortness of breath 09/14/2015  . Stroke ([place])   . Syncope 09/14/2015  . Throat cancer [facility])     Past Surgical History:  Procedure Laterality Date  . ANTERIOR CERVICAL DECOMP/DISCECTOMY FUSION  12/27/2010   Procedure: ANTERIOR CERVICAL DECOMPRESSION/DISCECTOMY FUSION 1 LEVEL;  Surgeon: [name], MD;  Location: [facility];  Service: Neurosurgery;  Laterality: N/A;  Cervical six-seven anterior cervical decompression fusion with interbody prosthesis and plating  . Laser surgery, retina     Diabetic retinopathy  . NASAL SINUS SURGERY    . port-a-cath placement    . PORT-A-CATH REMOVAL  2005    Family History  Problem Relation Age of Onset  . Diabetes Mother   . AAA (abdominal aortic aneurysm) Mother   . Heart Problems Father   . CAD Father   . Heart disease Father   . Heart disease Brother   . Heart Problems Brother   . Heart Problems  Brother   . Peripheral Artery Disease Brother   . Diabetes Brother   . Diabetes Brother   . Peripheral Artery Disease Brother     Social History Social History   Tobacco Use  . Smoking status: Former Smoker    Packs/day: 0.25    Years: 20.00    Pack years: 5.00    Types: Cigarettes    Last attempt to quit: 12/20/1980    Years since quitting: 36.7  . Smokeless tobacco: Never Used  Substance Use Topics  . Alcohol use: No    Comment: when young drank beers on weekend  . Drug use: No    Current Outpatient Medications  Medication Sig Dispense Refill  . aspirin EC 325 MG tablet Take 325 mg by mouth daily.     . calcitRIOL (ROCALTROL) 0.25 MCG capsule Take 1 capsule by mouth daily.    . clopidogrel (PLAVIX) 75 MG tablet Take 75 mg by mouth daily.    . furosemide (LASIX) 40 MG tablet Take 40 mg by mouth daily.      Marland Kitchen gabapentin (NEURONTIN) 400 MG capsule Take 1 capsule by mouth 2 (two) times daily.    . Insulin Glargine (BASAGLAR KWIKPEN) 100 UNIT/ML SOPN Inject 33 Units into the skin daily.    . isosorbide mononitrate (IMDUR) 30 MG 24 hr tablet Take 30 mg by mouth daily.    Marland Kitchen levothyroxine (SYNTHROID, LEVOTHROID) 100 MCG tablet Take 100 mcg by mouth daily before breakfast.    . metoprolol tartrate (LOPRESSOR) 50 MG tablet Take 50 mg by mouth 2 (two) times daily.     Marland Kitchen omeprazole (PRILOSEC) 20 MG capsule Take 20 mg by mouth daily.      . sodium bicarbonate 650 MG tablet Take 650 mg by mouth 2 (two) times daily.      . TRULICITY 1.5 YH/0.6CB SOPN     . vitamin B-12 (CYANOCOBALAMIN) 1000 MCG tablet Take 1,000 mcg by mouth daily.     No current facility-administered medications for this visit.     No Known Allergies  Review of Systems  Weight has been stable No hospitalizations since 2017 stroke No ankle swelling No abdominal pain No blood per rectum No dental complaints Mild difficulty swallowing but without aspiration No fever or night sweats Patient is very hard of  hearing  BP 114/74   Pulse 62   Resp 20   Ht 5' 7.5" (1.715 m)   Wt 182 lb (82.6 kg)   SpO2 99% Comment: RA  BMI 28.08 kg/m  Physical Exam      Exam    General- alert and comfortable    Neck- no JVD, no cervical adenopathy palpable, no carotid bruit   Lungs- clear without rales, wheezes   Cor- regular rate and rhythm, no murmur , gallop   Abdomen- soft, non-tender   Extremities - warm, non-tender, minimal edema  Neuro- oriented, appropriate, mild left sided weakness left arm and hand grip   Diagnostic Tests: Coronary angiogram show severe three-vessel disease in a diabetic pattern with suboptimal targets the left coronary circulation and adequate target in the dominant posterior descending.  Echo shows normal LV function but with LVH no significant valvular disease.  Impression: Because of the patient's severe comorbidities including advanced stage IV kidney disease, history of stroke, history of almost daily falls and poor targets for grafting I would not expect the patient to benefit from multivessel CABG.  I would recommend medical therapy is the best approach for this individual.  Plan: Continue medical therapy.  He would probably benefit from low-dose Crestor/Lipitor.   Len Childs, MD [facility] [phone]

## 2017-10-02 DIAGNOSIS — E118 Type 2 diabetes mellitus with unspecified complications: Secondary | ICD-10-CM | POA: Diagnosis not present

## 2017-10-02 DIAGNOSIS — E032 Hypothyroidism due to medicaments and other exogenous substances: Secondary | ICD-10-CM | POA: Diagnosis not present

## 2017-10-02 DIAGNOSIS — I10 Essential (primary) hypertension: Secondary | ICD-10-CM | POA: Diagnosis not present

## 2017-10-02 DIAGNOSIS — Z125 Encounter for screening for malignant neoplasm of prostate: Secondary | ICD-10-CM | POA: Diagnosis not present

## 2017-10-02 DIAGNOSIS — E7800 Pure hypercholesterolemia, unspecified: Secondary | ICD-10-CM | POA: Diagnosis not present

## 2017-10-02 DIAGNOSIS — Z0000 Encounter for general adult medical examination without abnormal findings: Secondary | ICD-10-CM | POA: Diagnosis not present

## 2017-10-02 DIAGNOSIS — Z23 Encounter for immunization: Secondary | ICD-10-CM | POA: Diagnosis not present

## 2017-10-09 DIAGNOSIS — E118 Type 2 diabetes mellitus with unspecified complications: Secondary | ICD-10-CM | POA: Diagnosis not present

## 2017-10-09 DIAGNOSIS — R296 Repeated falls: Secondary | ICD-10-CM | POA: Diagnosis not present

## 2017-10-09 DIAGNOSIS — N183 Chronic kidney disease, stage 3 (moderate): Secondary | ICD-10-CM | POA: Diagnosis not present

## 2017-10-09 DIAGNOSIS — I10 Essential (primary) hypertension: Secondary | ICD-10-CM | POA: Diagnosis not present

## 2017-10-09 DIAGNOSIS — I2510 Atherosclerotic heart disease of native coronary artery without angina pectoris: Secondary | ICD-10-CM | POA: Diagnosis not present

## 2017-10-09 DIAGNOSIS — E039 Hypothyroidism, unspecified: Secondary | ICD-10-CM | POA: Diagnosis not present

## 2017-10-09 DIAGNOSIS — I639 Cerebral infarction, unspecified: Secondary | ICD-10-CM | POA: Diagnosis not present

## 2017-11-01 DIAGNOSIS — S0990XA Unspecified injury of head, initial encounter: Secondary | ICD-10-CM | POA: Diagnosis not present

## 2017-11-01 DIAGNOSIS — M25512 Pain in left shoulder: Secondary | ICD-10-CM | POA: Diagnosis not present

## 2017-11-01 DIAGNOSIS — M542 Cervicalgia: Secondary | ICD-10-CM | POA: Diagnosis not present

## 2017-11-01 DIAGNOSIS — S12691A Other nondisplaced fracture of seventh cervical vertebra, initial encounter for closed fracture: Secondary | ICD-10-CM | POA: Diagnosis not present

## 2017-11-01 DIAGNOSIS — S199XXA Unspecified injury of neck, initial encounter: Secondary | ICD-10-CM | POA: Diagnosis not present

## 2017-11-01 DIAGNOSIS — Y998 Other external cause status: Secondary | ICD-10-CM | POA: Diagnosis not present

## 2017-11-01 DIAGNOSIS — Z23 Encounter for immunization: Secondary | ICD-10-CM | POA: Diagnosis not present

## 2017-11-01 DIAGNOSIS — S0083XA Contusion of other part of head, initial encounter: Secondary | ICD-10-CM | POA: Diagnosis not present

## 2017-11-01 DIAGNOSIS — R9431 Abnormal electrocardiogram [ECG] [EKG]: Secondary | ICD-10-CM | POA: Diagnosis not present

## 2017-11-01 DIAGNOSIS — W01198A Fall on same level from slipping, tripping and stumbling with subsequent striking against other object, initial encounter: Secondary | ICD-10-CM | POA: Diagnosis not present

## 2017-11-01 DIAGNOSIS — Y92002 Bathroom of unspecified non-institutional (private) residence single-family (private) house as the place of occurrence of the external cause: Secondary | ICD-10-CM | POA: Diagnosis not present

## 2017-11-02 DIAGNOSIS — R9431 Abnormal electrocardiogram [ECG] [EKG]: Secondary | ICD-10-CM | POA: Diagnosis not present

## 2017-11-14 DIAGNOSIS — Z981 Arthrodesis status: Secondary | ICD-10-CM | POA: Diagnosis not present

## 2017-11-14 DIAGNOSIS — M50121 Cervical disc disorder at C4-C5 level with radiculopathy: Secondary | ICD-10-CM | POA: Diagnosis not present

## 2017-11-14 DIAGNOSIS — S199XXA Unspecified injury of neck, initial encounter: Secondary | ICD-10-CM | POA: Diagnosis not present

## 2017-11-14 DIAGNOSIS — M4802 Spinal stenosis, cervical region: Secondary | ICD-10-CM | POA: Diagnosis not present

## 2017-11-15 ENCOUNTER — Emergency Department (HOSPITAL_COMMUNITY): Payer: Medicare Other

## 2017-11-15 ENCOUNTER — Inpatient Hospital Stay (HOSPITAL_COMMUNITY)
Admission: EM | Admit: 2017-11-15 | Discharge: 2017-12-16 | DRG: 082 | Disposition: E | Payer: Medicare Other | Attending: General Surgery | Admitting: General Surgery

## 2017-11-15 ENCOUNTER — Other Ambulatory Visit: Payer: Self-pay

## 2017-11-15 ENCOUNTER — Encounter (HOSPITAL_COMMUNITY): Payer: Self-pay | Admitting: Emergency Medicine

## 2017-11-15 DIAGNOSIS — Z87891 Personal history of nicotine dependence: Secondary | ICD-10-CM | POA: Diagnosis not present

## 2017-11-15 DIAGNOSIS — N183 Chronic kidney disease, stage 3 (moderate): Secondary | ICD-10-CM | POA: Diagnosis present

## 2017-11-15 DIAGNOSIS — R404 Transient alteration of awareness: Secondary | ICD-10-CM | POA: Diagnosis not present

## 2017-11-15 DIAGNOSIS — E119 Type 2 diabetes mellitus without complications: Secondary | ICD-10-CM | POA: Diagnosis not present

## 2017-11-15 DIAGNOSIS — S06899A Other specified intracranial injury with loss of consciousness of unspecified duration, initial encounter: Secondary | ICD-10-CM | POA: Diagnosis not present

## 2017-11-15 DIAGNOSIS — M549 Dorsalgia, unspecified: Secondary | ICD-10-CM

## 2017-11-15 DIAGNOSIS — E875 Hyperkalemia: Secondary | ICD-10-CM | POA: Diagnosis not present

## 2017-11-15 DIAGNOSIS — S2231XA Fracture of one rib, right side, initial encounter for closed fracture: Secondary | ICD-10-CM

## 2017-11-15 DIAGNOSIS — Z7982 Long term (current) use of aspirin: Secondary | ICD-10-CM

## 2017-11-15 DIAGNOSIS — R402362 Coma scale, best motor response, obeys commands, at arrival to emergency department: Secondary | ICD-10-CM | POA: Diagnosis present

## 2017-11-15 DIAGNOSIS — R069 Unspecified abnormalities of breathing: Secondary | ICD-10-CM

## 2017-11-15 DIAGNOSIS — S3993XA Unspecified injury of pelvis, initial encounter: Secondary | ICD-10-CM | POA: Diagnosis not present

## 2017-11-15 DIAGNOSIS — W19XXXA Unspecified fall, initial encounter: Secondary | ICD-10-CM

## 2017-11-15 DIAGNOSIS — R402142 Coma scale, eyes open, spontaneous, at arrival to emergency department: Secondary | ICD-10-CM | POA: Diagnosis present

## 2017-11-15 DIAGNOSIS — E870 Hyperosmolality and hypernatremia: Secondary | ICD-10-CM | POA: Diagnosis not present

## 2017-11-15 DIAGNOSIS — N179 Acute kidney failure, unspecified: Secondary | ICD-10-CM | POA: Diagnosis present

## 2017-11-15 DIAGNOSIS — Z79890 Hormone replacement therapy (postmenopausal): Secondary | ICD-10-CM | POA: Diagnosis not present

## 2017-11-15 DIAGNOSIS — S2241XA Multiple fractures of ribs, right side, initial encounter for closed fracture: Secondary | ICD-10-CM | POA: Diagnosis not present

## 2017-11-15 DIAGNOSIS — I129 Hypertensive chronic kidney disease with stage 1 through stage 4 chronic kidney disease, or unspecified chronic kidney disease: Secondary | ICD-10-CM | POA: Diagnosis present

## 2017-11-15 DIAGNOSIS — I629 Nontraumatic intracranial hemorrhage, unspecified: Secondary | ICD-10-CM

## 2017-11-15 DIAGNOSIS — T1490XA Injury, unspecified, initial encounter: Secondary | ICD-10-CM

## 2017-11-15 DIAGNOSIS — G9520 Unspecified cord compression: Secondary | ICD-10-CM | POA: Diagnosis present

## 2017-11-15 DIAGNOSIS — R402252 Coma scale, best verbal response, oriented, at arrival to emergency department: Secondary | ICD-10-CM | POA: Diagnosis present

## 2017-11-15 DIAGNOSIS — S0990XA Unspecified injury of head, initial encounter: Secondary | ICD-10-CM | POA: Diagnosis not present

## 2017-11-15 DIAGNOSIS — S2243XA Multiple fractures of ribs, bilateral, initial encounter for closed fracture: Secondary | ICD-10-CM | POA: Diagnosis present

## 2017-11-15 DIAGNOSIS — E039 Hypothyroidism, unspecified: Secondary | ICD-10-CM | POA: Diagnosis present

## 2017-11-15 DIAGNOSIS — S2242XA Multiple fractures of ribs, left side, initial encounter for closed fracture: Secondary | ICD-10-CM | POA: Diagnosis not present

## 2017-11-15 DIAGNOSIS — W109XXA Fall (on) (from) unspecified stairs and steps, initial encounter: Secondary | ICD-10-CM | POA: Diagnosis present

## 2017-11-15 DIAGNOSIS — R296 Repeated falls: Secondary | ICD-10-CM | POA: Diagnosis present

## 2017-11-15 DIAGNOSIS — J984 Other disorders of lung: Secondary | ICD-10-CM | POA: Diagnosis not present

## 2017-11-15 DIAGNOSIS — Y92009 Unspecified place in unspecified non-institutional (private) residence as the place of occurrence of the external cause: Secondary | ICD-10-CM

## 2017-11-15 DIAGNOSIS — Z794 Long term (current) use of insulin: Secondary | ICD-10-CM

## 2017-11-15 DIAGNOSIS — I2510 Atherosclerotic heart disease of native coronary artery without angina pectoris: Secondary | ICD-10-CM | POA: Diagnosis present

## 2017-11-15 DIAGNOSIS — S199XXA Unspecified injury of neck, initial encounter: Secondary | ICD-10-CM | POA: Diagnosis not present

## 2017-11-15 DIAGNOSIS — S065X0A Traumatic subdural hemorrhage without loss of consciousness, initial encounter: Secondary | ICD-10-CM | POA: Diagnosis not present

## 2017-11-15 DIAGNOSIS — S299XXA Unspecified injury of thorax, initial encounter: Secondary | ICD-10-CM | POA: Diagnosis not present

## 2017-11-15 DIAGNOSIS — Z515 Encounter for palliative care: Secondary | ICD-10-CM | POA: Diagnosis not present

## 2017-11-15 DIAGNOSIS — S069X9A Unspecified intracranial injury with loss of consciousness of unspecified duration, initial encounter: Secondary | ICD-10-CM | POA: Diagnosis not present

## 2017-11-15 DIAGNOSIS — Z7902 Long term (current) use of antithrombotics/antiplatelets: Secondary | ICD-10-CM

## 2017-11-15 DIAGNOSIS — E1122 Type 2 diabetes mellitus with diabetic chronic kidney disease: Secondary | ICD-10-CM | POA: Diagnosis present

## 2017-11-15 DIAGNOSIS — Z79899 Other long term (current) drug therapy: Secondary | ICD-10-CM

## 2017-11-15 DIAGNOSIS — S069X6A Unspecified intracranial injury with loss of consciousness greater than 24 hours without return to pre-existing conscious level with patient surviving, initial encounter: Secondary | ICD-10-CM | POA: Diagnosis not present

## 2017-11-15 DIAGNOSIS — S065X9A Traumatic subdural hemorrhage with loss of consciousness of unspecified duration, initial encounter: Secondary | ICD-10-CM | POA: Diagnosis not present

## 2017-11-15 DIAGNOSIS — Z66 Do not resuscitate: Secondary | ICD-10-CM | POA: Diagnosis present

## 2017-11-15 DIAGNOSIS — E1142 Type 2 diabetes mellitus with diabetic polyneuropathy: Secondary | ICD-10-CM | POA: Diagnosis present

## 2017-11-15 DIAGNOSIS — J9690 Respiratory failure, unspecified, unspecified whether with hypoxia or hypercapnia: Secondary | ICD-10-CM | POA: Diagnosis present

## 2017-11-15 DIAGNOSIS — I10 Essential (primary) hypertension: Secondary | ICD-10-CM | POA: Diagnosis not present

## 2017-11-15 DIAGNOSIS — J189 Pneumonia, unspecified organism: Secondary | ICD-10-CM | POA: Diagnosis present

## 2017-11-15 DIAGNOSIS — Z8673 Personal history of transient ischemic attack (TIA), and cerebral infarction without residual deficits: Secondary | ICD-10-CM | POA: Diagnosis not present

## 2017-11-15 DIAGNOSIS — S06360A Traumatic hemorrhage of cerebrum, unspecified, without loss of consciousness, initial encounter: Secondary | ICD-10-CM | POA: Diagnosis not present

## 2017-11-15 DIAGNOSIS — S066X0A Traumatic subarachnoid hemorrhage without loss of consciousness, initial encounter: Secondary | ICD-10-CM | POA: Diagnosis not present

## 2017-11-15 DIAGNOSIS — M4316 Spondylolisthesis, lumbar region: Secondary | ICD-10-CM | POA: Diagnosis not present

## 2017-11-15 DIAGNOSIS — S3991XA Unspecified injury of abdomen, initial encounter: Secondary | ICD-10-CM | POA: Diagnosis not present

## 2017-11-15 DIAGNOSIS — S069XAA Unspecified intracranial injury with loss of consciousness status unknown, initial encounter: Secondary | ICD-10-CM | POA: Diagnosis present

## 2017-11-15 LAB — COMPREHENSIVE METABOLIC PANEL
ALT: 13 U/L (ref 0–44)
AST: 15 U/L (ref 15–41)
Albumin: 3.5 g/dL (ref 3.5–5.0)
Alkaline Phosphatase: 89 U/L (ref 38–126)
Anion gap: 8 (ref 5–15)
BILIRUBIN TOTAL: 0.7 mg/dL (ref 0.3–1.2)
BUN: 33 mg/dL — ABNORMAL HIGH (ref 8–23)
CALCIUM: 9.5 mg/dL (ref 8.9–10.3)
CO2: 30 mmol/L (ref 22–32)
CREATININE: 2.11 mg/dL — AB (ref 0.61–1.24)
Chloride: 100 mmol/L (ref 98–111)
GFR calc Af Amer: 34 mL/min — ABNORMAL LOW (ref >60)
GFR, EST NON AFRICAN AMERICAN: 30 mL/min — AB (ref >60)
Glucose, Bld: 147 mg/dL — ABNORMAL HIGH (ref 70–99)
Potassium: 5.1 mmol/L (ref 3.5–5.1)
Sodium: 138 mmol/L (ref 135–145)
TOTAL PROTEIN: 7.5 g/dL (ref 6.5–8.1)

## 2017-11-15 LAB — CBC
HCT: 36.7 % — ABNORMAL LOW (ref 39.0–52.0)
Hemoglobin: 12.1 g/dL — ABNORMAL LOW (ref 13.0–17.0)
MCH: 30.6 pg (ref 26.0–34.0)
MCHC: 33 g/dL (ref 30.0–36.0)
MCV: 92.9 fL (ref 80.0–100.0)
Platelets: 149 10*3/uL — ABNORMAL LOW (ref 150–400)
RBC: 3.95 MIL/uL — ABNORMAL LOW (ref 4.22–5.81)
RDW: 13.4 % (ref 11.5–15.5)
WBC: 7.8 10*3/uL (ref 4.0–10.5)
nRBC: 0 % (ref 0.0–0.2)

## 2017-11-15 LAB — I-STAT CHEM 8, ED
BUN: 44 mg/dL — AB (ref 8–23)
CREATININE: 2.2 mg/dL — AB (ref 0.61–1.24)
Calcium, Ion: 1.06 mmol/L — ABNORMAL LOW (ref 1.15–1.40)
Chloride: 100 mmol/L (ref 98–111)
Glucose, Bld: 147 mg/dL — ABNORMAL HIGH (ref 70–99)
HEMATOCRIT: 37 % — AB (ref 39.0–52.0)
Hemoglobin: 12.6 g/dL — ABNORMAL LOW (ref 13.0–17.0)
POTASSIUM: 5.4 mmol/L — AB (ref 3.5–5.1)
Sodium: 138 mmol/L (ref 135–145)
TCO2: 33 mmol/L — ABNORMAL HIGH (ref 22–32)

## 2017-11-15 LAB — PROTIME-INR
INR: 1.08
PROTHROMBIN TIME: 13.9 s (ref 11.4–15.2)

## 2017-11-15 LAB — MRSA PCR SCREENING: MRSA BY PCR: NEGATIVE

## 2017-11-15 LAB — SAMPLE TO BLOOD BANK

## 2017-11-15 LAB — I-STAT CG4 LACTIC ACID, ED: LACTIC ACID, VENOUS: 1 mmol/L (ref 0.5–1.9)

## 2017-11-15 MED ORDER — FAMOTIDINE IN NACL 20-0.9 MG/50ML-% IV SOLN
20.0000 mg | INTRAVENOUS | Status: DC
  Administered 2017-11-15 – 2017-11-18 (×4): 20 mg via INTRAVENOUS
  Filled 2017-11-15 (×4): qty 50

## 2017-11-15 MED ORDER — FENTANYL CITRATE (PF) 100 MCG/2ML IJ SOLN
25.0000 ug | INTRAMUSCULAR | Status: DC | PRN
  Administered 2017-11-15: 50 ug via INTRAVENOUS
  Administered 2017-11-17: 25 ug via INTRAVENOUS
  Filled 2017-11-15 (×2): qty 2

## 2017-11-15 MED ORDER — NICARDIPINE HCL IN NACL 20-0.86 MG/200ML-% IV SOLN
3.0000 mg/h | Freq: Once | INTRAVENOUS | Status: AC
  Administered 2017-11-15: 3 mg/h via INTRAVENOUS
  Filled 2017-11-15: qty 200

## 2017-11-15 MED ORDER — FENTANYL CITRATE (PF) 100 MCG/2ML IJ SOLN
75.0000 ug | Freq: Once | INTRAMUSCULAR | Status: AC
  Administered 2017-11-15: 75 ug via INTRAVENOUS
  Filled 2017-11-15: qty 2

## 2017-11-15 MED ORDER — LEVOTHYROXINE SODIUM 100 MCG IV SOLR
50.0000 ug | Freq: Every day | INTRAVENOUS | Status: DC
  Administered 2017-11-15 – 2017-11-19 (×5): 50 ug via INTRAVENOUS
  Filled 2017-11-15 (×6): qty 5

## 2017-11-15 MED ORDER — ONDANSETRON HCL 4 MG/2ML IJ SOLN: 4.0000 mg | Freq: Four times a day (QID) | INTRAMUSCULAR | Status: DC | PRN

## 2017-11-15 MED ORDER — POTASSIUM CHLORIDE IN NACL 20-0.9 MEQ/L-% IV SOLN
INTRAVENOUS | Status: DC
  Administered 2017-11-15: 15:00:00 via INTRAVENOUS
  Filled 2017-11-15 (×2): qty 1000

## 2017-11-15 MED ORDER — ONDANSETRON 4 MG PO TBDP: 4.0000 mg | ORAL_TABLET | Freq: Four times a day (QID) | ORAL | Status: DC | PRN

## 2017-11-15 MED ORDER — HYDRALAZINE HCL 20 MG/ML IJ SOLN: 10.0000 mg | INTRAMUSCULAR | Status: DC | PRN

## 2017-11-15 NOTE — ED Notes (Signed)
Pt back from CT scan

## 2017-11-15 NOTE — ED Notes (Signed)
Trauma surgeon at the bedside.

## 2017-11-15 NOTE — ED Notes (Signed)
Paged neurosurg. Per PA Mia

## 2017-11-15 NOTE — Progress Notes (Signed)
Orthopedic Tech Progress Note Patient Details:  Todd Hall 01/16/1875 [id number]  Patient ID: [name], male   DOB: 01/16/1875, 72 y.o.   MRN: [id number]   Todd Hall 11/01/2017, 11:23 AM Made level 2 trauma visit

## 2017-11-15 NOTE — H&P (Addendum)
[facility] Admission Note  Todd Hall 12/30/1945  [id number].    Requesting MD: Zenia Resides Chief Complaint/Reason for Consult: level 2 trauma HPI:  Patient is a 72 year old male with PMH significant for CAD, Hx of CVA, diabetes, CKD stage III who presented today to [facility] as a level 2 trauma after falling down some stairs this AM. Patient has had several recent falls and was seen at [facility] about 1.5 months ago after a fall where an old cervical spine fracture was found. Patient was placed in a cervical collar and referred to a neurosurgeon in [facility] who he was scheduled to see tomorrow. Patient lives at home with his wife and reportedly tried to get up by himself with a cane prior to falling. Patient is normally mentally with it per wife and grandson, but since falling has been disoriented. Was reportedly asking for his son who passed away a few months ago. Wife reports patient is chronically weaker on his left side. Patient is on plavix for CAD. NKDA. Patient's wife and grandson report he is very hard of hearing.   ROS: Review of Systems  Unable to perform ROS: Mental status change    No family history on file.  No past medical history on file.  Social History:  has no tobacco, alcohol, and drug history on file.  Allergies: No Known Allergies   (Not in a hospital admission)  Blood pressure (!) 152/122, pulse 64, temperature (!) 97.5 F (36.4 C), temperature source Oral, resp. rate 14, height _0  (1.676 m), weight 69.9 kg, SpO2 100 %. Physical Exam: Physical Exam  Constitutional: He appears well-developed. He appears lethargic. He appears cachectic. He is cooperative. Cervical collar and nasal cannula in place.  HENT:  Head: Normocephalic. Head is without Battle's sign and without laceration.  Right Ear: Tympanic membrane, external ear and ear canal normal.  Left Ear: Tympanic membrane, external ear and ear canal normal.  Nose: Nose normal.  Mouth/Throat: Mucous  membranes are not pale, dry and not cyanotic.  Periorbital ecchymosis of uncertain chronicity   Eyes: Conjunctivae and lids are normal. No scleral icterus.  Pupils equal and round 3 mm  Neck: No tracheal deviation present.  Cardiovascular: Normal rate and regular rhythm.  Pulses:      Radial pulses are 2+ on the right side, and 2+ on the left side.       Dorsalis pedis pulses are 2+ on the right side, and 2+ on the left side.  Pulmonary/Chest: No accessory muscle usage. No tachypnea. No respiratory distress. He has no decreased breath sounds. He has no wheezes. He has no rhonchi. He has no rales.  Abdominal: Soft. Bowel sounds are normal. He exhibits no distension. There is no hepatosplenomegaly. There is no tenderness. There is no guarding.  Genitourinary: Penis normal.  Genitourinary Comments: Condom catheter present  Musculoskeletal:  No gross deformities of all 4 extremities;  Neurological: He appears lethargic. GCS eye subscore is 4. GCS verbal subscore is 5. GCS motor subscore is 6.  Patient oriented to self but does not seem to be oriented to place, time or situation.   Skin: Skin is warm, dry and intact.  Psychiatric:  Not currently assessable    Results for orders placed or performed during the hospital encounter of 11/13/2017 (from the past 48 hour(s))  Sample to Blood Bank     Status: None   Collection Time: 11/13/2017 11:37 AM  Result Value Ref Range   Blood Bank Specimen  SAMPLE AVAILABLE FOR TESTING    Sample Expiration      11/16/2017 Performed at [facility], [place] [address] [place], [place] [address]   I-Stat Chem 8, ED     Status: Abnormal   Collection Time: 11/01/2017 11:40 AM  Result Value Ref Range   Sodium 138 135 - 145 mmol/L   Potassium 5.4 (H) 3.5 - 5.1 mmol/L   Chloride 100 98 - 111 mmol/L   BUN 44 (H) 8 - 23 mg/dL   Creatinine, Ser 2.20 (H) 0.61 - 1.24 mg/dL   Glucose, Bld 147 (H) 70 - 99 mg/dL   Calcium, Ion 1.06 (L) 1.15 - 1.40 mmol/L   TCO2 33  (H) 22 - 32 mmol/L   Hemoglobin 12.6 (L) 13.0 - 17.0 g/dL   HCT 37.0 (L) 39.0 - 52.0 %  I-Stat CG4 Lactic Acid, ED     Status: None   Collection Time: 11/08/2017 11:41 AM  Result Value Ref Range   Lactic Acid, Venous 1.00 0.5 - 1.9 mmol/L  Comprehensive metabolic panel     Status: Abnormal   Collection Time: 11/13/2017 11:57 AM  Result Value Ref Range   Sodium 138 135 - 145 mmol/L   Potassium 5.1 3.5 - 5.1 mmol/L   Chloride 100 98 - 111 mmol/L   CO2 30 22 - 32 mmol/L   Glucose, Bld 147 (H) 70 - 99 mg/dL   BUN 33 (H) 8 - 23 mg/dL   Creatinine, Ser 2.11 (H) 0.61 - 1.24 mg/dL   Calcium 9.5 8.9 - 10.3 mg/dL   Total Protein 7.5 6.5 - 8.1 g/dL   Albumin 3.5 3.5 - 5.0 g/dL   AST 15 15 - 41 U/L   ALT 13 0 - 44 U/L   Alkaline Phosphatase 89 38 - 126 U/L   Total Bilirubin 0.7 0.3 - 1.2 mg/dL   GFR calc non Af Amer 30 (L) >60 mL/min   GFR calc Af Amer 34 (L) >60 mL/min    Comment: (NOTE) The eGFR has been calculated using the CKD EPI equation. This calculation has not been validated in all clinical situations. eGFR's persistently <60 mL/min signify possible Chronic Kidney Disease.    Anion gap 8 5 - 15    Comment: Performed at [facility] [address] [place], [place] [address]  CBC     Status: Abnormal   Collection Time: 10/22/2017 11:57 AM  Result Value Ref Range   WBC 7.8 4.0 - 10.5 K/uL   RBC 3.95 (L) 4.22 - 5.81 MIL/uL   Hemoglobin 12.1 (L) 13.0 - 17.0 g/dL   HCT 36.7 (L) 39.0 - 52.0 %   MCV 92.9 80.0 - 100.0 fL   MCH 30.6 26.0 - 34.0 pg   MCHC 33.0 30.0 - 36.0 g/dL   RDW 13.4 11.5 - 15.5 %   Platelets 149 (L) 150 - 400 K/uL   nRBC 0.0 0.0 - 0.2 %    Comment: Performed at [facility], [place] [address], [place], [place] [address]  Protime-INR     Status: None   Collection Time: 10/30/2017 11:57 AM  Result Value Ref Range   Prothrombin Time 13.9 11.4 - 15.2 seconds   INR 1.08     Comment: Performed at [facility] [address] [place], [place] [address]    Ct Head Wo Contrast  Result Date: 10/18/2017 CLINICAL DATA:  Mechanical fall down 13 stairs. Loss of consciousness. EXAM: CT HEAD WITHOUT CONTRAST  CT CERVICAL SPINE WITHOUT CONTRAST TECHNIQUE: Multidetector CT imaging of the head and cervical spine was performed following the standard protocol without intravenous contrast. Multiplanar CT image reconstructions of the cervical spine were also generated. COMPARISON:  11/01/2017. FINDINGS: CT HEAD FINDINGS Brain: There are multiple areas of intracranial hemorrhage. There is a parenchymal hemorrhage centered on the left ventricular nuclei, measuring 26 x 18 x 18 mm. There is adjacent less well-defined parenchymal hemorrhage anterior to the base a ganglia and along the anterior left insular cortex. There is a parenchymal hemorrhage in the subcortical white matter of the superior, posterior right frontal lobe, measuring 19 x 12 x 17 mm, with a tiny adjacent focus of subcortical white matter hemorrhage. Parenchymal hemorrhage is seen in the inferior right frontal lobe anterior to the sylvian fissure. There are also small areas of subarachnoid hemorrhage, along the right sylvian fissure, over the anterior left frontal lobe, in the anterior medial frontal lobes adjacent to the interhemispheric fissure and in the superior right parietal lobe. Subdural hemorrhage is noted along the anterior interhemispheric fissure. There is intraventricular hemorrhage, between the septum flu syndrome and in the right lateral ventricle. There is no significant mass effect and no midline shift. No evidence of an ischemic infarct. The ventricles are normal in overall configuration. There is no hydrocephalus. No extra-axial masses. Vascular: No hyperdense vessel or unexpected calcification. Skull: No skull fracture or lesion. Sinuses/Orbits: Globes and orbits are unremarkable. Mild ethmoid and left maxillary sinus mucosal thickening. Small amount of dependent fluid in the left maxillary  sinus. Other: None. CT CERVICAL SPINE FINDINGS Alignment: Slight degenerative anterolisthesis of C3-C4, stable. No other spondylolisthesis. Skull base and vertebrae: No acute fracture. Nondisplaced fracture of left C7 transverse process is stable from the prior study. No primary bone lesion or focal pathologic process. Soft tissues and spinal canal: No prevertebral fluid or swelling. No visible canal hematoma. Disc levels: Status post anterior cervical disc fusion at C6-C7. Anterior fusion plate and fixation screws well-seated. There is mature bone fusing the disc interspace. The appearance is stable. There is spondylotic disc bulging and endplate spurring from D6-L8 through C5-C6. No evidence of an acute disc herniation. Facet degenerative changes are noted most severe on the right at C3-C4. These findings are stable. Upper chest: No acute findings. No mass or adenopathy. Clear lung apices. Other: None. IMPRESSION: HEAD CT 1. There is acute intracranial hemorrhage with areas of parenchymal hemorrhage, subarachnoid and subdural hemorrhage and intraventricular hemorrhage as detailed above. No hydrocephalus. No midline shift. 2. No skull fracture. Critical Value/emergent results were called by telephone at the time of interpretation on 11/11/2017 at 12:09 pm to Dr. Maree Erie [facility] , who verbally acknowledged these results. CERVICAL CT 1. No acute fracture or acute finding. C7 left transverse process fracture noted on the prior study is stable. Electronically Signed   By: Lajean Manes M.D.   On: 10/26/2017 12:13   Ct Cervical Spine Wo Contrast  Result Date: 11/01/2017 CLINICAL DATA:  Mechanical fall down 13 stairs. Loss of consciousness. EXAM: CT HEAD WITHOUT CONTRAST CT CERVICAL SPINE WITHOUT CONTRAST TECHNIQUE: Multidetector CT imaging of the head and cervical spine was performed following the standard protocol without intravenous contrast. Multiplanar CT image reconstructions of the cervical spine were also  generated. COMPARISON:  11/01/2017. FINDINGS: CT HEAD FINDINGS Brain: There are multiple areas of intracranial hemorrhage. There is a parenchymal hemorrhage centered on the left ventricular nuclei, measuring 26 x 18 x 18 mm. There is adjacent less well-defined parenchymal hemorrhage  anterior to the base a ganglia and along the anterior left insular cortex. There is a parenchymal hemorrhage in the subcortical white matter of the superior, posterior right frontal lobe, measuring 19 x 12 x 17 mm, with a tiny adjacent focus of subcortical white matter hemorrhage. Parenchymal hemorrhage is seen in the inferior right frontal lobe anterior to the sylvian fissure. There are also small areas of subarachnoid hemorrhage, along the right sylvian fissure, over the anterior left frontal lobe, in the anterior medial frontal lobes adjacent to the interhemispheric fissure and in the superior right parietal lobe. Subdural hemorrhage is noted along the anterior interhemispheric fissure. There is intraventricular hemorrhage, between the septum flu syndrome and in the right lateral ventricle. There is no significant mass effect and no midline shift. No evidence of an ischemic infarct. The ventricles are normal in overall configuration. There is no hydrocephalus. No extra-axial masses. Vascular: No hyperdense vessel or unexpected calcification. Skull: No skull fracture or lesion. Sinuses/Orbits: Globes and orbits are unremarkable. Mild ethmoid and left maxillary sinus mucosal thickening. Small amount of dependent fluid in the left maxillary sinus. Other: None. CT CERVICAL SPINE FINDINGS Alignment: Slight degenerative anterolisthesis of C3-C4, stable. No other spondylolisthesis. Skull base and vertebrae: No acute fracture. Nondisplaced fracture of left C7 transverse process is stable from the prior study. No primary bone lesion or focal pathologic process. Soft tissues and spinal canal: No prevertebral fluid or swelling. No visible canal  hematoma. Disc levels: Status post anterior cervical disc fusion at C6-C7. Anterior fusion plate and fixation screws well-seated. There is mature bone fusing the disc interspace. The appearance is stable. There is spondylotic disc bulging and endplate spurring from B7-J6 through C5-C6. No evidence of an acute disc herniation. Facet degenerative changes are noted most severe on the right at C3-C4. These findings are stable. Upper chest: No acute findings. No mass or adenopathy. Clear lung apices. Other: None. IMPRESSION: HEAD CT 1. There is acute intracranial hemorrhage with areas of parenchymal hemorrhage, subarachnoid and subdural hemorrhage and intraventricular hemorrhage as detailed above. No hydrocephalus. No midline shift. 2. No skull fracture. Critical Value/emergent results were called by telephone at the time of interpretation on 10/22/2017 at 12:09 pm to Dr. Maree Erie [facility] , who verbally acknowledged these results. CERVICAL CT 1. No acute fracture or acute finding. C7 left transverse process fracture noted on the prior study is stable. Electronically Signed   By: Lajean Manes M.D.   On: 10/23/2017 12:13   Dg Pelvis Portable  Result Date: 11/11/2017 CLINICAL DATA:  72 year old male status post fall down steps. Level 2 trauma. EXAM: PORTABLE PELVIS 1-2 VIEWS COMPARISON:  None available. FINDINGS: Bone mineralization is within normal limits for age. Femoral heads are normally located. Proximal femurs appear grossly intact. No pelvis fracture identified. SI joints appear normal. Lower lumbar disc and endplate degeneration. Negative visible abdominal and pelvic visceral contours aside from iliofemoral calcified atherosclerosis. IMPRESSION: No acute fracture or dislocation identified about the pelvis. Electronically Signed   By: Genevie Ann M.D.   On: 11/12/2017 11:50   Dg Chest Port 1 View  Result Date: 10/20/2017 CLINICAL DATA:  Pain following fall EXAM: PORTABLE CHEST 1 VIEW COMPARISON:  None. FINDINGS:  Lungs are clear. Heart size is upper normal with pulmonary vascularity normal. No adenopathy. No pneumothorax. There are nondisplaced fractures of the posterolateral right ninth, tenth, and eleventh ribs. There is a suspected incomplete fracture of the lateral right seventh rib. Postoperative change noted in the lower cervical spine region. IMPRESSION:  Several nondisplaced rib fractures on the right. No pneumothorax or pleural effusion evident. No edema or consolidation. Heart upper normal in size. Electronically Signed   By: Lowella Grip III M.D.   On: 11/03/2017 11:50      Assessment/Plan Fall Parenchymal hemorrghage with SAH and SDH along interhemispheric fissure - NS consulted Remote C7 left TVP fx - continue collar R 9-11th rib fractures - no ptx seen on CXR, CT chest pending; IS, mulitmodal pain control, pulmonary toilet HTN - on cardene gtt CAD - hold plavix CKD stage III - Cr 2.11, IVF and monitor T2DM - SSI Hypothyroidism - IV synthroid Hx of CVA in 2017  FEN: NPO, IVF VTE: SCDs ID: no current abx  Dispo: Admit to ICU. NS to consult. CT chest/abd/pelvis pending.   Brigid Re, [facility] 11/12/2017, 1:10 PM Pager: [phone] Mon-Fri 7:00 am-4:30 pm Sat-Sun 7:00 am-11:30 am

## 2017-11-15 NOTE — Consult Note (Signed)
Reason for Consult:traumatic ich, sah Referring Physician: trauma ed  Todd Hall is an 72 y.o. male.  HPI: whom has taken multiple falls in the last month at least. He was seen in the [facility] ED this week prior for falling. After falling down the stairs today, he was brought to the [facility] ED for evaluation.  Past Medical History:  Diagnosis Date  . Stroke [facility])     History reviewed. No pertinent surgical history.  No family history on file.  Social History:  reports that he has quit smoking. He has never used smokeless tobacco. He reports that he drank alcohol. He reports that he has current or past drug history.  Allergies: No Known Allergies  Medications: I have reviewed the patient's current medications.  Results for orders placed or performed during the hospital encounter of 11/04/2017 (from the past 48 hour(s))  Sample to Blood Bank     Status: None   Collection Time: 11/05/2017 11:37 AM  Result Value Ref Range   Blood Bank Specimen SAMPLE AVAILABLE FOR TESTING    Sample Expiration      11/16/2017 Performed at [facility], [place] [address], [place], [place] [address]   I-Stat Chem 8, ED     Status: Abnormal   Collection Time: 10/28/2017 11:40 AM  Result Value Ref Range   Sodium 138 135 - 145 mmol/L   Potassium 5.4 (H) 3.5 - 5.1 mmol/L   Chloride 100 98 - 111 mmol/L   BUN 44 (H) 8 - 23 mg/dL   Creatinine, Ser 2.20 (H) 0.61 - 1.24 mg/dL   Glucose, Bld 147 (H) 70 - 99 mg/dL   Calcium, Ion 1.06 (L) 1.15 - 1.40 mmol/L   TCO2 33 (H) 22 - 32 mmol/L   Hemoglobin 12.6 (L) 13.0 - 17.0 g/dL   HCT 37.0 (L) 39.0 - 52.0 %  I-Stat CG4 Lactic Acid, ED     Status: None   Collection Time: 11/09/2017 11:41 AM  Result Value Ref Range   Lactic Acid, Venous 1.00 0.5 - 1.9 mmol/L  Comprehensive metabolic panel     Status: Abnormal   Collection Time: 11/12/2017 11:57 AM  Result Value Ref Range   Sodium 138 135 - 145 mmol/L   Potassium 5.1 3.5 - 5.1 mmol/L   Chloride 100 98 - 111 mmol/L    CO2 30 22 - 32 mmol/L   Glucose, Bld 147 (H) 70 - 99 mg/dL   BUN 33 (H) 8 - 23 mg/dL   Creatinine, Ser 2.11 (H) 0.61 - 1.24 mg/dL   Calcium 9.5 8.9 - 10.3 mg/dL   Total Protein 7.5 6.5 - 8.1 g/dL   Albumin 3.5 3.5 - 5.0 g/dL   AST 15 15 - 41 U/L   ALT 13 0 - 44 U/L   Alkaline Phosphatase 89 38 - 126 U/L   Total Bilirubin 0.7 0.3 - 1.2 mg/dL   GFR calc non Af Amer 30 (L) >60 mL/min   GFR calc Af Amer 34 (L) >60 mL/min    Comment: (NOTE) The eGFR has been calculated using the CKD EPI equation. This calculation has not been validated in all clinical situations. eGFR's persistently <60 mL/min signify possible Chronic Kidney Disease.    Anion gap 8 5 - 15    Comment: Performed at [facility] [address], [place], [place] [address]  CBC     Status: Abnormal   Collection Time: 10/29/2017 11:57 AM  Result Value Ref Range  WBC 7.8 4.0 - 10.5 K/uL   RBC 3.95 (L) 4.22 - 5.81 MIL/uL   Hemoglobin 12.1 (L) 13.0 - 17.0 g/dL   HCT 36.7 (L) 39.0 - 52.0 %   MCV 92.9 80.0 - 100.0 fL   MCH 30.6 26.0 - 34.0 pg   MCHC 33.0 30.0 - 36.0 g/dL   RDW 13.4 11.5 - 15.5 %   Platelets 149 (L) 150 - 400 K/uL   nRBC 0.0 0.0 - 0.2 %    Comment: Performed at [facility], [place] [address], [place], [place] [address]  Protime-INR     Status: None   Collection Time: 10/23/2017 11:57 AM  Result Value Ref Range   Prothrombin Time 13.9 11.4 - 15.2 seconds   INR 1.08     Comment: Performed at [facility] [address], [place], [place] [address]  MRSA PCR Screening     Status: None   Collection Time: 10/31/2017  2:12 PM  Result Value Ref Range   MRSA by PCR NEGATIVE NEGATIVE    Comment:        The GeneXpert MRSA Assay (FDA approved for NASAL specimens only), is one component of a comprehensive MRSA colonization surveillance program. It is not intended to diagnose MRSA infection nor to guide or monitor treatment for MRSA infections. Performed at [facility], [place] [address], [place], [place] [address]     Ct Abdomen Pelvis Wo Contrast  Result Date: 10/28/2017 CLINICAL DATA:  [place] down stairs. EXAM: CT CHEST, ABDOMEN AND PELVIS WITHOUT CONTRAST TECHNIQUE: Multidetector CT imaging of the chest, abdomen and pelvis was performed following the standard protocol without IV contrast. COMPARISON:  PET-CT on 04/25/2006 FINDINGS: CT CHEST FINDINGS Cardiovascular: There is extensive atherosclerotic calcification of the coronary arteries. Heart size is normal. No pericardial effusion. There is atherosclerotic calcification of the thoracic aorta not associated with aneurysm or evidence for dissection. The noncontrast appearance of the pulmonary arteries is normal. Mediastinum/Nodes: The visualized portion of the thyroid gland has a normal appearance. Esophagus is normal in appearance. No mediastinal, hilar, or axillary adenopathy. Lungs/Pleura: There are dependent changes in the posterior aspects of the lungs bilaterally. The appearance favors atelectasis over contusion. Musculoskeletal: There are acute fractures of the RIGHT 7th, 8th, and 9th ribs. There are acute fractures of the LEFT 10th and 11th ribs. No vertebral fracture. CT ABDOMEN PELVIS FINDINGS Hepatobiliary: The liver is homogeneous without focal lesion or evidence for laceration. Gallbladder is present. Pancreas: Unremarkable. No pancreatic ductal dilatation or surrounding inflammatory changes. Spleen: Normal in size without focal abnormality. Adrenals/Urinary Tract: A 9 millimeter LEFT adrenal adenoma is present. The RIGHT adrenal gland is normal in appearance. The noncontrast appearance of both kidneys is normal. There is no hydronephrosis. The ureters are unremarkable. The bladder and visualized portion of the urethra are normal. Stomach/Bowel: The stomach and small bowel loops are normal in appearance. The loops of colon are normal in appearance. Moderate stool burden in the rectosigmoid colon. The appendix is well seen and  has a normal appearance. Vascular/Lymphatic: There is atherosclerotic calcification of the abdominal aorta not associated with aneurysm. No retroperitoneal or mesenteric adenopathy. Reproductive: Prostate is unremarkable. Other: No abdominal wall hernia or abnormality. No abdominopelvic ascites. Musculoskeletal: There are degenerative changes in the RIGHT hip and in the mid lumbar spine. Bilateral pars defects at L3 are associated with grade 1 anterolisthesis of L3 on L4. See report for the spine, performed on the same day. IMPRESSION: 1. Bilateral  rib fractures, ribs 7, 8, 9 on the RIGHT and ribs 10 and 11 on the LEFT cough. 2. Bibasilar atelectasis. No significant contusions or pneumothorax. 3. No evidence for injury of the abdomen/pelvis. 4. Benign LEFT adrenal adenoma. 5. Moderate stool burden. 6.  Aortic atherosclerosis.  (ICD10-I70.0) 7. Pars defects and grade 1 anterolisthesis of L3 on L4. No acute fracture of the spine or pelvis. Electronically Signed   By: Nolon Nations M.D.   On: 10/21/2017 13:56   Ct Head Wo Contrast  Result Date: 11/05/2017 CLINICAL DATA:  Mechanical fall down 13 stairs. Loss of consciousness. EXAM: CT HEAD WITHOUT CONTRAST CT CERVICAL SPINE WITHOUT CONTRAST TECHNIQUE: Multidetector CT imaging of the head and cervical spine was performed following the standard protocol without intravenous contrast. Multiplanar CT image reconstructions of the cervical spine were also generated. COMPARISON:  11/01/2017. FINDINGS: CT HEAD FINDINGS Brain: There are multiple areas of intracranial hemorrhage. There is a parenchymal hemorrhage centered on the left ventricular nuclei, measuring 26 x 18 x 18 mm. There is adjacent less well-defined parenchymal hemorrhage anterior to the base a ganglia and along the anterior left insular cortex. There is a parenchymal hemorrhage in the subcortical white matter of the superior, posterior right frontal lobe, measuring 19 x 12 x 17 mm, with a tiny adjacent  focus of subcortical white matter hemorrhage. Parenchymal hemorrhage is seen in the inferior right frontal lobe anterior to the sylvian fissure. There are also small areas of subarachnoid hemorrhage, along the right sylvian fissure, over the anterior left frontal lobe, in the anterior medial frontal lobes adjacent to the interhemispheric fissure and in the superior right parietal lobe. Subdural hemorrhage is noted along the anterior interhemispheric fissure. There is intraventricular hemorrhage, between the septum flu syndrome and in the right lateral ventricle. There is no significant mass effect and no midline shift. No evidence of an ischemic infarct. The ventricles are normal in overall configuration. There is no hydrocephalus. No extra-axial masses. Vascular: No hyperdense vessel or unexpected calcification. Skull: No skull fracture or lesion. Sinuses/Orbits: Globes and orbits are unremarkable. Mild ethmoid and left maxillary sinus mucosal thickening. Small amount of dependent fluid in the left maxillary sinus. Other: None. CT CERVICAL SPINE FINDINGS Alignment: Slight degenerative anterolisthesis of C3-C4, stable. No other spondylolisthesis. Skull base and vertebrae: No acute fracture. Nondisplaced fracture of left C7 transverse process is stable from the prior study. No primary bone lesion or focal pathologic process. Soft tissues and spinal canal: No prevertebral fluid or swelling. No visible canal hematoma. Disc levels: Status post anterior cervical disc fusion at C6-C7. Anterior fusion plate and fixation screws well-seated. There is mature bone fusing the disc interspace. The appearance is stable. There is spondylotic disc bulging and endplate spurring from E7-O3 through C5-C6. No evidence of an acute disc herniation. Facet degenerative changes are noted most severe on the right at C3-C4. These findings are stable. Upper chest: No acute findings. No mass or adenopathy. Clear lung apices. Other: None.  IMPRESSION: HEAD CT 1. There is acute intracranial hemorrhage with areas of parenchymal hemorrhage, subarachnoid and subdural hemorrhage and intraventricular hemorrhage as detailed above. No hydrocephalus. No midline shift. 2. No skull fracture. Critical Value/emergent results were called by telephone at the time of interpretation on 11/12/2017 at 12:09 pm to Dr. Maree [facility] , who verbally acknowledged these results. CERVICAL CT 1. No acute fracture or acute finding. C7 left transverse process fracture noted on the prior study is stable. Electronically Signed   By: Lajean Manes  M.D.   On: 10/23/2017 12:13   Ct Chest Wo Contrast  Result Date: 10/19/2017 CLINICAL DATA:  [place] [address] down stairs. EXAM: CT CHEST, ABDOMEN AND PELVIS WITHOUT CONTRAST TECHNIQUE: Multidetector CT imaging of the chest, abdomen and pelvis was performed following the standard protocol without IV contrast. COMPARISON:  PET-CT on 04/25/2006 FINDINGS: CT CHEST FINDINGS Cardiovascular: There is extensive atherosclerotic calcification of the coronary arteries. Heart size is normal. No pericardial effusion. There is atherosclerotic calcification of the thoracic aorta not associated with aneurysm or evidence for dissection. The noncontrast appearance of the pulmonary arteries is normal. Mediastinum/Nodes: The visualized portion of the thyroid gland has a normal appearance. Esophagus is normal in appearance. No mediastinal, hilar, or axillary adenopathy. Lungs/Pleura: There are dependent changes in the posterior aspects of the lungs bilaterally. The appearance favors atelectasis over contusion. Musculoskeletal: There are acute fractures of the RIGHT 7th, 8th, and 9th ribs. There are acute fractures of the LEFT 10th and 11th ribs. No vertebral fracture. CT ABDOMEN PELVIS FINDINGS Hepatobiliary: The liver is homogeneous without focal lesion or evidence for laceration. Gallbladder is present. Pancreas: Unremarkable. No pancreatic ductal dilatation or  surrounding inflammatory changes. Spleen: Normal in size without focal abnormality. Adrenals/Urinary Tract: A 9 millimeter LEFT adrenal adenoma is present. The RIGHT adrenal gland is normal in appearance. The noncontrast appearance of both kidneys is normal. There is no hydronephrosis. The ureters are unremarkable. The bladder and visualized portion of the urethra are normal. Stomach/Bowel: The stomach and small bowel loops are normal in appearance. The loops of colon are normal in appearance. Moderate stool burden in the rectosigmoid colon. The appendix is well seen and has a normal appearance. Vascular/Lymphatic: There is atherosclerotic calcification of the abdominal aorta not associated with aneurysm. No retroperitoneal or mesenteric adenopathy. Reproductive: Prostate is unremarkable. Other: No abdominal wall hernia or abnormality. No abdominopelvic ascites. Musculoskeletal: There are degenerative changes in the RIGHT hip and in the mid lumbar spine. Bilateral pars defects at L3 are associated with grade 1 anterolisthesis of L3 on L4. See report for the spine, performed on the same day. IMPRESSION: 1. Bilateral rib fractures, ribs 7, 8, 9 on the RIGHT and ribs 10 and 11 on the LEFT cough. 2. Bibasilar atelectasis. No significant contusions or pneumothorax. 3. No evidence for injury of the abdomen/pelvis. 4. Benign LEFT adrenal adenoma. 5. Moderate stool burden. 6.  Aortic atherosclerosis.  (ICD10-I70.0) 7. Pars defects and grade 1 anterolisthesis of L3 on L4. No acute fracture of the spine or pelvis. Electronically Signed   By: Nolon Nations M.D.   On: 11/10/2017 13:56   Ct Cervical Spine Wo Contrast  Result Date: 11/05/2017 CLINICAL DATA:  Mechanical fall down 13 stairs. Loss of consciousness. EXAM: CT HEAD WITHOUT CONTRAST CT CERVICAL SPINE WITHOUT CONTRAST TECHNIQUE: Multidetector CT imaging of the head and cervical spine was performed following the standard protocol without intravenous contrast.  Multiplanar CT image reconstructions of the cervical spine were also generated. COMPARISON:  11/01/2017. FINDINGS: CT HEAD FINDINGS Brain: There are multiple areas of intracranial hemorrhage. There is a parenchymal hemorrhage centered on the left ventricular nuclei, measuring 26 x 18 x 18 mm. There is adjacent less well-defined parenchymal hemorrhage anterior to the base a ganglia and along the anterior left insular cortex. There is a parenchymal hemorrhage in the subcortical white matter of the superior, posterior right frontal lobe, measuring 19 x 12 x 17 mm, with a tiny adjacent focus of subcortical white matter hemorrhage. Parenchymal hemorrhage is seen in the inferior  right frontal lobe anterior to the sylvian fissure. There are also small areas of subarachnoid hemorrhage, along the right sylvian fissure, over the anterior left frontal lobe, in the anterior medial frontal lobes adjacent to the interhemispheric fissure and in the superior right parietal lobe. Subdural hemorrhage is noted along the anterior interhemispheric fissure. There is intraventricular hemorrhage, between the septum flu syndrome and in the right lateral ventricle. There is no significant mass effect and no midline shift. No evidence of an ischemic infarct. The ventricles are normal in overall configuration. There is no hydrocephalus. No extra-axial masses. Vascular: No hyperdense vessel or unexpected calcification. Skull: No skull fracture or lesion. Sinuses/Orbits: Globes and orbits are unremarkable. Mild ethmoid and left maxillary sinus mucosal thickening. Small amount of dependent fluid in the left maxillary sinus. Other: None. CT CERVICAL SPINE FINDINGS Alignment: Slight degenerative anterolisthesis of C3-C4, stable. No other spondylolisthesis. Skull base and vertebrae: No acute fracture. Nondisplaced fracture of left C7 transverse process is stable from the prior study. No primary bone lesion or focal pathologic process. Soft tissues  and spinal canal: No prevertebral fluid or swelling. No visible canal hematoma. Disc levels: Status post anterior cervical disc fusion at C6-C7. Anterior fusion plate and fixation screws well-seated. There is mature bone fusing the disc interspace. The appearance is stable. There is spondylotic disc bulging and endplate spurring from E9-M0 through C5-C6. No evidence of an acute disc herniation. Facet degenerative changes are noted most severe on the right at C3-C4. These findings are stable. Upper chest: No acute findings. No mass or adenopathy. Clear lung apices. Other: None. IMPRESSION: HEAD CT 1. There is acute intracranial hemorrhage with areas of parenchymal hemorrhage, subarachnoid and subdural hemorrhage and intraventricular hemorrhage as detailed above. No hydrocephalus. No midline shift. 2. No skull fracture. Critical Value/emergent results were called by telephone at the time of interpretation on 11/02/2017 at 12:09 pm to Dr. Maree [facility] , who verbally acknowledged these results. CERVICAL CT 1. No acute fracture or acute finding. C7 left transverse process fracture noted on the prior study is stable. Electronically Signed   By: Lajean Manes M.D.   On: 11/12/2017 12:13   Dg Pelvis Portable  Result Date: 11/09/2017 CLINICAL DATA:  73 year old male status post fall down steps. Level 2 trauma. EXAM: PORTABLE PELVIS 1-2 VIEWS COMPARISON:  None available. FINDINGS: Bone mineralization is within normal limits for age. Femoral heads are normally located. Proximal femurs appear grossly intact. No pelvis fracture identified. SI joints appear normal. Lower lumbar disc and endplate degeneration. Negative visible abdominal and pelvic visceral contours aside from iliofemoral calcified atherosclerosis. IMPRESSION: No acute fracture or dislocation identified about the pelvis. Electronically Signed   By: Genevie Ann M.D.   On: 11/07/2017 11:50   Ct T-spine No Charge  Result Date: 11/06/2017 CLINICAL DATA:  Back  pain.  Fell, with cervical spine trauma. EXAM: CT THORACIC AND LUMBAR SPINE WITHOUT CONTRAST TECHNIQUE: Multidetector CT imaging of the thoracic and lumbar spine was performed without contrast. Multiplanar CT image reconstructions were also generated. COMPARISON:  None. FINDINGS: CT THORACIC SPINE FINDINGS Alignment: Anatomic/physiologic. Vertebrae: No thoracic spine fracture is evident. Paraspinal and other soft tissues: Unremarkable. No pneumothorax or visible mass. See separate CT chest report. Disc levels: Shallow calcific protrusions at T6-7 and T9-10 do not appear compressive. CT LUMBAR SPINE FINDINGS Segmentation: Standard. Alignment: 4 mm anterolisthesis L3-4.  Trace retrolisthesis L5-S1. Vertebrae: Sclerotic change above and below L3-4 related to chronic disc space narrowing. BILATERAL L3 pars defects. Paraspinal and other  soft tissues: Reported under CT abdomen and pelvis separately. Disc levels: L1-L2:  Normal. L2-L3:  Calcified annulus.  Facet arthropathy.  No fracture. L3-L4: 4 mm anterolisthesis. Severe disc space narrowing, near complete collapse. Osseous spurring. No compression fracture. BILATERAL L3 pars defects. Facet arthropathy is superimposed. Moderate stenosis is likely just below the L3-4 interspace. BILATERAL L3 and L4 neural impingement are possible. L4-L5: Unremarkable disc space. Facet arthropathy. No subarticular zone narrowing. BILATERAL foraminal narrowing slightly worse on the LEFT. L5-S1: Osseous spurring. Trace retrolisthesis. Facet arthropathy. Subarticular zone and foraminal zone narrowing could affect the L5 and S1 nerve roots. IMPRESSION: CT THORACIC SPINE IMPRESSION No thoracic vertebral body fracture or traumatic subluxation. Minor disc pathology, unlikely to be compressive. CT LUMBAR SPINE IMPRESSION 4 mm of slip at L3-4 is chronic, related to BILATERAL L3 pars defects. Moderate stenosis is likely at this level. Disc pathology at L4-5 and L5-S1 is suboptimally evaluated on  noncontrast lumbar CT, but could potentially contribute to subarticular zone or foraminal zone narrowing. No posttraumatic sequelae such as fracture or traumatic subluxation, are evident. Electronically Signed   By: Staci Righter M.D.   On: 11/09/2017 13:51   Ct L-spine No Charge  Result Date: 10/24/2017 CLINICAL DATA:  Back pain.  Fell, with cervical spine trauma. EXAM: CT THORACIC AND LUMBAR SPINE WITHOUT CONTRAST TECHNIQUE: Multidetector CT imaging of the thoracic and lumbar spine was performed without contrast. Multiplanar CT image reconstructions were also generated. COMPARISON:  None. FINDINGS: CT THORACIC SPINE FINDINGS Alignment: Anatomic/physiologic. Vertebrae: No thoracic spine fracture is evident. Paraspinal and other soft tissues: Unremarkable. No pneumothorax or visible mass. See separate CT chest report. Disc levels: Shallow calcific protrusions at T6-7 and T9-10 do not appear compressive. CT LUMBAR SPINE FINDINGS Segmentation: Standard. Alignment: 4 mm anterolisthesis L3-4.  Trace retrolisthesis L5-S1. Vertebrae: Sclerotic change above and below L3-4 related to chronic disc space narrowing. BILATERAL L3 pars defects. Paraspinal and other soft tissues: Reported under CT abdomen and pelvis separately. Disc levels: L1-L2:  Normal. L2-L3:  Calcified annulus.  Facet arthropathy.  No fracture. L3-L4: 4 mm anterolisthesis. Severe disc space narrowing, near complete collapse. Osseous spurring. No compression fracture. BILATERAL L3 pars defects. Facet arthropathy is superimposed. Moderate stenosis is likely just below the L3-4 interspace. BILATERAL L3 and L4 neural impingement are possible. L4-L5: Unremarkable disc space. Facet arthropathy. No subarticular zone narrowing. BILATERAL foraminal narrowing slightly worse on the LEFT. L5-S1: Osseous spurring. Trace retrolisthesis. Facet arthropathy. Subarticular zone and foraminal zone narrowing could affect the L5 and S1 nerve roots. IMPRESSION: CT THORACIC  SPINE IMPRESSION No thoracic vertebral body fracture or traumatic subluxation. Minor disc pathology, unlikely to be compressive. CT LUMBAR SPINE IMPRESSION 4 mm of slip at L3-4 is chronic, related to BILATERAL L3 pars defects. Moderate stenosis is likely at this level. Disc pathology at L4-5 and L5-S1 is suboptimally evaluated on noncontrast lumbar CT, but could potentially contribute to subarticular zone or foraminal zone narrowing. No posttraumatic sequelae such as fracture or traumatic subluxation, are evident. Electronically Signed   By: Staci Righter M.D.   On: 10/31/2017 13:51   Dg Chest Port 1 View  Result Date: 11/07/2017 CLINICAL DATA:  Pain following fall EXAM: PORTABLE CHEST 1 VIEW COMPARISON:  None. FINDINGS: Lungs are clear. Heart size is upper normal with pulmonary vascularity normal. No adenopathy. No pneumothorax. There are nondisplaced fractures of the posterolateral right ninth, tenth, and eleventh ribs. There is a suspected incomplete fracture of the lateral right seventh rib. Postoperative change noted in  the lower cervical spine region. IMPRESSION: Several nondisplaced rib fractures on the right. No pneumothorax or pleural effusion evident. No edema or consolidation. Heart upper normal in size. Electronically Signed   By: Lowella Grip III M.D.   On: 10/19/2017 11:50    Review of Systems  Constitutional: Negative.   Musculoskeletal: Positive for falls and neck pain.  Neurological: Positive for focal weakness and weakness.       Gait difficulties  Psychiatric/Behavioral: Negative.    Blood pressure (!) 159/89, pulse 82, temperature 98.5 F (36.9 C), temperature source Axillary, resp. rate 15, height 5' 6" (1.676 m), weight 69.9 kg, SpO2 100 %. Physical Exam  Constitutional: No distress.  Appears ill  HENT:  Multiple bruises about face and forehead, various ages  Eyes: Pupils are equal, round, and reactive to light. Conjunctivae and EOM are normal.  Neck:  In cervical  collar  Respiratory: Effort normal and breath sounds normal.  Neurological: He is alert. A cranial nerve deficit and sensory deficit is present. Coordination abnormal. GCS eye subscore is 4. GCS verbal subscore is 5. GCS motor subscore is 6.  Follows commands, answering all questions Dense hearing loss bilaterally Voice is weak Gait not assessed Moving right side > left    Assessment/Plan: Will monitor head CT, despite the very large disc herniation and cord compression, Mr. Dockendorf is not an operative candidate due to multiple severe morbidities. He was refused a heart procedure due to his profound disease. Will stop the plavix. Repeat head ct tomorrow  , L 11/14/2017, 8:08 PM

## 2017-11-15 NOTE — ED Notes (Signed)
Family updated as to patient's status currently at beside with [facility] for discussion about care and plane.

## 2017-11-15 NOTE — ED Notes (Signed)
Patient transported to CT with EMT

## 2017-11-15 NOTE — ED Notes (Signed)
Pt transported to CT with Trauma RN at beside.

## 2017-11-15 NOTE — ED Triage Notes (Signed)
Patient presents to the ED by EMS with c/o unwitnessed FALL down 13 stairs while ambulating with walker at home. Pt with known neck injury from recent falls-collar in place and was not removed. Pt on back board, c-spine maintained. Pt conscious and alert but confused and slow to response. Initial call was for CPR but no compressions were started.  HX of stroke with left side deficits.   Vitals 170/110 97% RA 70 P 24 capnography 171 cbg NSR

## 2017-11-15 NOTE — ED Provider Notes (Signed)
[facility] EMERGENCY DEPARTMENT Provider Note   CSN: [id number] Arrival date & time: 11/05/2017  1116     History   Chief Complaint Chief Complaint  Patient presents with  . Fall  . Trauma  . Neck Injury    HPI Todd Hall is a 72 y.o. male with a h/o of HOH, CAD, DM, HTN, CKD, aneurysm of ascending aorta, polyneuropathy who presents to the emergency department by EMS as a level 2 trauma for fall.  EMS reports the patient was using his walker when he fell down approximately 13 steps. EMS reports they were originally called to the scene for CPR, but no CPR was performed. He has an Designer, multimedia in place for a known cervical fracture.   No family is present at the bedside.  Level V caveat secondary acuity condition.   The history is provided by the patient, the spouse and the EMS personnel. No language interpreter was used.    Past Medical History:  Diagnosis Date  . Stroke [facility])     Patient Active Problem List   Diagnosis Date Noted  . TBI (traumatic brain injury) ([place]) 11/09/2017     Home Medications    Prior to Admission medications   Medication Sig Start Date End Date Taking? Authorizing Provider  atorvastatin (LIPITOR) 40 MG tablet Take 40 mg by mouth daily. 08/27/17  Yes [provider]  calcitRIOL (ROCALTROL) 0.25 MCG capsule Take 0.25 mcg by mouth daily. 09/14/17  Yes [provider]  clopidogrel (PLAVIX) 75 MG tablet Take 75 mg by mouth daily. 08/27/17  Yes [provider]  furosemide (LASIX) 40 MG tablet Take 40 mg by mouth daily. 08/27/17  Yes [provider]  gabapentin (NEURONTIN) 400 MG capsule Take 400 mg by mouth 2 (two) times daily. 09/11/17  Yes [provider]  levothyroxine (SYNTHROID, LEVOTHROID) 100 MCG tablet Take 100 mcg by mouth daily. 11/04/17  Yes [provider]  metoprolol tartrate (LOPRESSOR) 50 MG tablet Take 50 mg by mouth 2 (two) times daily. 08/27/17  Yes [provider]  omeprazole (PRILOSEC) 20 MG capsule Take 20 mg by mouth daily. 11/04/17  Yes [provider]  sodium bicarbonate 650 MG tablet Take 650 mg by mouth daily. 11/13/17  Yes [provider]    Family History No family history on file.  Social History Social History   Tobacco Use  . Smoking status: Former Research scientist (life sciences)  . Smokeless tobacco: Never Used  Substance Use Topics  . Alcohol use: Not Currently  . Drug use: Not Currently     Allergies   Patient has no known allergies.   Review of Systems Review of Systems  Unable to perform ROS: Acuity of condition     Physical Exam Updated Vital Signs BP (!) 152/90   Pulse 76   Temp 98.5 F (36.9 C) (Axillary)   Resp 16   Ht 5\' 6"  (1.676 m)   Wt 69.9 kg   SpO2 100%   BMI 24.86 kg/m   Physical Exam  Constitutional:  Chronically ill-appearing elderly male  HENT:  Right Ear: External ear normal.  Left Ear: External ear normal.  Left periorbital ecchymosis that appears several days old.  Voice is hoarse.  Eyes: EOM are normal.  2 mm pupils bilaterally. Round and active to light.   Neck:  Aspen collar in place  Cardiovascular: Normal rate. Exam reveals no gallop and no friction rub.  No murmur heard. 2+ radial and DP pulses.  Pulmonary/Chest: No respiratory distress.  Clear and equal breath sounds bilaterally.  Abrasion noted to the left anterior chest wall, overlying the left clavicle.  Ecchymosis noted to the right lateral chest wall that appears old.  Tender to palpation to the right chest wall.  No crepitus, obvious deformities, or step-offs.  Abdominal:  Abdomen is soft, nondistended, nontender.  Musculoskeletal:  No tenderness to the bilateral hips.  Pelvis appears stable.  Neurological: He is alert.  GCS 15.  Follows simple commands.  Moves all 4 extremities.  Good strength against resistance with all 4 extremities.  Skin:  All extremities are warm and dry to the touch.  Nursing note  and vitals reviewed.  ED Treatments / Results  Labs (all labs ordered are listed, but only abnormal results are displayed) Labs Reviewed  COMPREHENSIVE METABOLIC PANEL - Abnormal; Notable for the following components:      Result Value   Glucose, Bld 147 (*)    BUN 33 (*)    Creatinine, Ser 2.11 (*)    GFR calc non Af Amer 30 (*)    GFR calc Af Amer 34 (*)    All other components within normal limits  CBC - Abnormal; Notable for the following components:   RBC 3.95 (*)    Hemoglobin 12.1 (*)    HCT 36.7 (*)    Platelets 149 (*)    All other components within normal limits  I-STAT CHEM 8, ED - Abnormal; Notable for the following components:   Potassium 5.4 (*)    BUN 44 (*)    Creatinine, Ser 2.20 (*)    Glucose, Bld 147 (*)    Calcium, Ion 1.06 (*)    TCO2 33 (*)    Hemoglobin 12.6 (*)    HCT 37.0 (*)    All other components within normal limits  MRSA PCR SCREENING  PROTIME-INR  CDS SEROLOGY  ETHANOL  URINALYSIS, ROUTINE W REFLEX MICROSCOPIC  CBC  BASIC METABOLIC PANEL  I-STAT CG4 LACTIC ACID, ED  SAMPLE TO BLOOD BANK    EKG None  Radiology Ct Abdomen Pelvis Wo Contrast  Result Date: 11/12/2017 CLINICAL DATA:  [place] down stairs. EXAM: CT CHEST, ABDOMEN AND PELVIS WITHOUT CONTRAST TECHNIQUE: Multidetector CT imaging of the chest, abdomen and pelvis was performed following the standard protocol without IV contrast. COMPARISON:  PET-CT on 04/25/2006 FINDINGS: CT CHEST FINDINGS Cardiovascular: There is extensive atherosclerotic calcification of the coronary arteries. Heart size is normal. No pericardial effusion. There is atherosclerotic calcification of the thoracic aorta not associated with aneurysm or evidence for dissection. The noncontrast appearance of the pulmonary arteries is normal. Mediastinum/Nodes: The visualized portion of the thyroid gland has a normal appearance. Esophagus is normal in appearance. No mediastinal, hilar, or axillary adenopathy. Lungs/Pleura:  There are dependent changes in the posterior aspects of the lungs bilaterally. The appearance favors atelectasis over contusion. Musculoskeletal: There are acute fractures of the RIGHT 7th, 8th, and 9th ribs. There are acute fractures of the LEFT 10th and 11th ribs. No vertebral fracture. CT ABDOMEN PELVIS FINDINGS Hepatobiliary: The liver is homogeneous without focal lesion or evidence for laceration. Gallbladder is present. Pancreas: Unremarkable. No pancreatic ductal dilatation or surrounding inflammatory changes. Spleen: Normal in size without focal abnormality. Adrenals/Urinary Tract: A 9 millimeter LEFT adrenal adenoma is present. The RIGHT adrenal gland is normal in appearance. The noncontrast appearance of both kidneys is normal. There is no hydronephrosis. The ureters are unremarkable. The bladder and visualized portion of the urethra are normal. Stomach/Bowel:  The stomach and small bowel loops are normal in appearance. The loops of colon are normal in appearance. Moderate stool burden in the rectosigmoid colon. The appendix is well seen and has a normal appearance. Vascular/Lymphatic: There is atherosclerotic calcification of the abdominal aorta not associated with aneurysm. No retroperitoneal or mesenteric adenopathy. Reproductive: Prostate is unremarkable. Other: No abdominal wall hernia or abnormality. No abdominopelvic ascites. Musculoskeletal: There are degenerative changes in the RIGHT hip and in the mid lumbar spine. Bilateral pars defects at L3 are associated with grade 1 anterolisthesis of L3 on L4. See report for the spine, performed on the same day. IMPRESSION: 1. Bilateral rib fractures, ribs 7, 8, 9 on the RIGHT and ribs 10 and 11 on the LEFT cough. 2. Bibasilar atelectasis. No significant contusions or pneumothorax. 3. No evidence for injury of the abdomen/pelvis. 4. Benign LEFT adrenal adenoma. 5. Moderate stool burden. 6.  Aortic atherosclerosis.  (ICD10-I70.0) 7. Pars defects and grade 1  anterolisthesis of L3 on L4. No acute fracture of the spine or pelvis. Electronically Signed   By: Nolon Nations M.D.   On: 10/19/2017 13:56   Ct Head Wo Contrast  Result Date: 10/17/2017 CLINICAL DATA:  Mechanical fall down 13 stairs. Loss of consciousness. EXAM: CT HEAD WITHOUT CONTRAST CT CERVICAL SPINE WITHOUT CONTRAST TECHNIQUE: Multidetector CT imaging of the head and cervical spine was performed following the standard protocol without intravenous contrast. Multiplanar CT image reconstructions of the cervical spine were also generated. COMPARISON:  11/01/2017. FINDINGS: CT HEAD FINDINGS Brain: There are multiple areas of intracranial hemorrhage. There is a parenchymal hemorrhage centered on the left ventricular nuclei, measuring 26 x 18 x 18 mm. There is adjacent less well-defined parenchymal hemorrhage anterior to the base a ganglia and along the anterior left insular cortex. There is a parenchymal hemorrhage in the subcortical white matter of the superior, posterior right frontal lobe, measuring 19 x 12 x 17 mm, with a tiny adjacent focus of subcortical white matter hemorrhage. Parenchymal hemorrhage is seen in the inferior right frontal lobe anterior to the sylvian fissure. There are also small areas of subarachnoid hemorrhage, along the right sylvian fissure, over the anterior left frontal lobe, in the anterior medial frontal lobes adjacent to the interhemispheric fissure and in the superior right parietal lobe. Subdural hemorrhage is noted along the anterior interhemispheric fissure. There is intraventricular hemorrhage, between the septum flu syndrome and in the right lateral ventricle. There is no significant mass effect and no midline shift. No evidence of an ischemic infarct. The ventricles are normal in overall configuration. There is no hydrocephalus. No extra-axial masses. Vascular: No hyperdense vessel or unexpected calcification. Skull: No skull fracture or lesion. Sinuses/Orbits: Globes  and orbits are unremarkable. Mild ethmoid and left maxillary sinus mucosal thickening. Small amount of dependent fluid in the left maxillary sinus. Other: None. CT CERVICAL SPINE FINDINGS Alignment: Slight degenerative anterolisthesis of C3-C4, stable. No other spondylolisthesis. Skull base and vertebrae: No acute fracture. Nondisplaced fracture of left C7 transverse process is stable from the prior study. No primary bone lesion or focal pathologic process. Soft tissues and spinal canal: No prevertebral fluid or swelling. No visible canal hematoma. Disc levels: Status post anterior cervical disc fusion at C6-C7. Anterior fusion plate and fixation screws well-seated. There is mature bone fusing the disc interspace. The appearance is stable. There is spondylotic disc bulging and endplate spurring from G8-Q7 through C5-C6. No evidence of an acute disc herniation. Facet degenerative changes are noted most severe on the  right at C3-C4. These findings are stable. Upper chest: No acute findings. No mass or adenopathy. Clear lung apices. Other: None. IMPRESSION: HEAD CT 1. There is acute intracranial hemorrhage with areas of parenchymal hemorrhage, subarachnoid and subdural hemorrhage and intraventricular hemorrhage as detailed above. No hydrocephalus. No midline shift. 2. No skull fracture. Critical Value/emergent results were called by telephone at the time of interpretation on 11/01/2017 at 12:09 pm to Dr. Maree Erie [facility] , who verbally acknowledged these results. CERVICAL CT 1. No acute fracture or acute finding. C7 left transverse process fracture noted on the prior study is stable. Electronically Signed   By: Lajean Manes M.D.   On: 10/21/2017 12:13   Ct Chest Wo Contrast  Result Date: 10/20/2017 CLINICAL DATA:  [place] down stairs. EXAM: CT CHEST, ABDOMEN AND PELVIS WITHOUT CONTRAST TECHNIQUE: Multidetector CT imaging of the chest, abdomen and pelvis was performed following the standard protocol without IV contrast.  COMPARISON:  PET-CT on 04/25/2006 FINDINGS: CT CHEST FINDINGS Cardiovascular: There is extensive atherosclerotic calcification of the coronary arteries. Heart size is normal. No pericardial effusion. There is atherosclerotic calcification of the thoracic aorta not associated with aneurysm or evidence for dissection. The noncontrast appearance of the pulmonary arteries is normal. Mediastinum/Nodes: The visualized portion of the thyroid gland has a normal appearance. Esophagus is normal in appearance. No mediastinal, hilar, or axillary adenopathy. Lungs/Pleura: There are dependent changes in the posterior aspects of the lungs bilaterally. The appearance favors atelectasis over contusion. Musculoskeletal: There are acute fractures of the RIGHT 7th, 8th, and 9th ribs. There are acute fractures of the LEFT 10th and 11th ribs. No vertebral fracture. CT ABDOMEN PELVIS FINDINGS Hepatobiliary: The liver is homogeneous without focal lesion or evidence for laceration. Gallbladder is present. Pancreas: Unremarkable. No pancreatic ductal dilatation or surrounding inflammatory changes. Spleen: Normal in size without focal abnormality. Adrenals/Urinary Tract: A 9 millimeter LEFT adrenal adenoma is present. The RIGHT adrenal gland is normal in appearance. The noncontrast appearance of both kidneys is normal. There is no hydronephrosis. The ureters are unremarkable. The bladder and visualized portion of the urethra are normal. Stomach/Bowel: The stomach and small bowel loops are normal in appearance. The loops of colon are normal in appearance. Moderate stool burden in the rectosigmoid colon. The appendix is well seen and has a normal appearance. Vascular/Lymphatic: There is atherosclerotic calcification of the abdominal aorta not associated with aneurysm. No retroperitoneal or mesenteric adenopathy. Reproductive: Prostate is unremarkable. Other: No abdominal wall hernia or abnormality. No abdominopelvic ascites. Musculoskeletal:  There are degenerative changes in the RIGHT hip and in the mid lumbar spine. Bilateral pars defects at L3 are associated with grade 1 anterolisthesis of L3 on L4. See report for the spine, performed on the same day. IMPRESSION: 1. Bilateral rib fractures, ribs 7, 8, 9 on the RIGHT and ribs 10 and 11 on the LEFT cough. 2. Bibasilar atelectasis. No significant contusions or pneumothorax. 3. No evidence for injury of the abdomen/pelvis. 4. Benign LEFT adrenal adenoma. 5. Moderate stool burden. 6.  Aortic atherosclerosis.  (ICD10-I70.0) 7. Pars defects and grade 1 anterolisthesis of L3 on L4. No acute fracture of the spine or pelvis. Electronically Signed   By: Nolon Nations M.D.   On: 10/18/2017 13:56   Ct Cervical Spine Wo Contrast  Result Date: 11/13/2017 CLINICAL DATA:  Mechanical fall down 13 stairs. Loss of consciousness. EXAM: CT HEAD WITHOUT CONTRAST CT CERVICAL SPINE WITHOUT CONTRAST TECHNIQUE: Multidetector CT imaging of the head and cervical spine was performed following the  standard protocol without intravenous contrast. Multiplanar CT image reconstructions of the cervical spine were also generated. COMPARISON:  11/01/2017. FINDINGS: CT HEAD FINDINGS Brain: There are multiple areas of intracranial hemorrhage. There is a parenchymal hemorrhage centered on the left ventricular nuclei, measuring 26 x 18 x 18 mm. There is adjacent less well-defined parenchymal hemorrhage anterior to the base a ganglia and along the anterior left insular cortex. There is a parenchymal hemorrhage in the subcortical white matter of the superior, posterior right frontal lobe, measuring 19 x 12 x 17 mm, with a tiny adjacent focus of subcortical white matter hemorrhage. Parenchymal hemorrhage is seen in the inferior right frontal lobe anterior to the sylvian fissure. There are also small areas of subarachnoid hemorrhage, along the right sylvian fissure, over the anterior left frontal lobe, in the anterior medial frontal lobes  adjacent to the interhemispheric fissure and in the superior right parietal lobe. Subdural hemorrhage is noted along the anterior interhemispheric fissure. There is intraventricular hemorrhage, between the septum flu syndrome and in the right lateral ventricle. There is no significant mass effect and no midline shift. No evidence of an ischemic infarct. The ventricles are normal in overall configuration. There is no hydrocephalus. No extra-axial masses. Vascular: No hyperdense vessel or unexpected calcification. Skull: No skull fracture or lesion. Sinuses/Orbits: Globes and orbits are unremarkable. Mild ethmoid and left maxillary sinus mucosal thickening. Small amount of dependent fluid in the left maxillary sinus. Other: None. CT CERVICAL SPINE FINDINGS Alignment: Slight degenerative anterolisthesis of C3-C4, stable. No other spondylolisthesis. Skull base and vertebrae: No acute fracture. Nondisplaced fracture of left C7 transverse process is stable from the prior study. No primary bone lesion or focal pathologic process. Soft tissues and spinal canal: No prevertebral fluid or swelling. No visible canal hematoma. Disc levels: Status post anterior cervical disc fusion at C6-C7. Anterior fusion plate and fixation screws well-seated. There is mature bone fusing the disc interspace. The appearance is stable. There is spondylotic disc bulging and endplate spurring from V5-I4 through C5-C6. No evidence of an acute disc herniation. Facet degenerative changes are noted most severe on the right at C3-C4. These findings are stable. Upper chest: No acute findings. No mass or adenopathy. Clear lung apices. Other: None. IMPRESSION: HEAD CT 1. There is acute intracranial hemorrhage with areas of parenchymal hemorrhage, subarachnoid and subdural hemorrhage and intraventricular hemorrhage as detailed above. No hydrocephalus. No midline shift. 2. No skull fracture. Critical Value/emergent results were called by telephone at the  time of interpretation on 11/14/2017 at 12:09 pm to Dr. Maree Erie [facility] , who verbally acknowledged these results. CERVICAL CT 1. No acute fracture or acute finding. C7 left transverse process fracture noted on the prior study is stable. Electronically Signed   By: Lajean Manes M.D.   On: 10/16/2017 12:13   Dg Pelvis Portable  Result Date: 11/05/2017 CLINICAL DATA:  72 year old male status post fall down steps. Level 2 trauma. EXAM: PORTABLE PELVIS 1-2 VIEWS COMPARISON:  None available. FINDINGS: Bone mineralization is within normal limits for age. Femoral heads are normally located. Proximal femurs appear grossly intact. No pelvis fracture identified. SI joints appear normal. Lower lumbar disc and endplate degeneration. Negative visible abdominal and pelvic visceral contours aside from iliofemoral calcified atherosclerosis. IMPRESSION: No acute fracture or dislocation identified about the pelvis. Electronically Signed   By: Genevie Ann M.D.   On: 10/23/2017 11:50   Ct T-spine No Charge  Result Date: 10/31/2017 CLINICAL DATA:  Back pain.  Fell, with cervical spine trauma.  EXAM: CT THORACIC AND LUMBAR SPINE WITHOUT CONTRAST TECHNIQUE: Multidetector CT imaging of the thoracic and lumbar spine was performed without contrast. Multiplanar CT image reconstructions were also generated. COMPARISON:  None. FINDINGS: CT THORACIC SPINE FINDINGS Alignment: Anatomic/physiologic. Vertebrae: No thoracic spine fracture is evident. Paraspinal and other soft tissues: Unremarkable. No pneumothorax or visible mass. See separate CT chest report. Disc levels: Shallow calcific protrusions at T6-7 and T9-10 do not appear compressive. CT LUMBAR SPINE FINDINGS Segmentation: Standard. Alignment: 4 mm anterolisthesis L3-4.  Trace retrolisthesis L5-S1. Vertebrae: Sclerotic change above and below L3-4 related to chronic disc space narrowing. BILATERAL L3 pars defects. Paraspinal and other soft tissues: Reported under CT abdomen and pelvis  separately. Disc levels: L1-L2:  Normal. L2-L3:  Calcified annulus.  Facet arthropathy.  No fracture. L3-L4: 4 mm anterolisthesis. Severe disc space narrowing, near complete collapse. Osseous spurring. No compression fracture. BILATERAL L3 pars defects. Facet arthropathy is superimposed. Moderate stenosis is likely just below the L3-4 interspace. BILATERAL L3 and L4 neural impingement are possible. L4-L5: Unremarkable disc space. Facet arthropathy. No subarticular zone narrowing. BILATERAL foraminal narrowing slightly worse on the LEFT. L5-S1: Osseous spurring. Trace retrolisthesis. Facet arthropathy. Subarticular zone and foraminal zone narrowing could affect the L5 and S1 nerve roots. IMPRESSION: CT THORACIC SPINE IMPRESSION No thoracic vertebral body fracture or traumatic subluxation. Minor disc pathology, unlikely to be compressive. CT LUMBAR SPINE IMPRESSION 4 mm of slip at L3-4 is chronic, related to BILATERAL L3 pars defects. Moderate stenosis is likely at this level. Disc pathology at L4-5 and L5-S1 is suboptimally evaluated on noncontrast lumbar CT, but could potentially contribute to subarticular zone or foraminal zone narrowing. No posttraumatic sequelae such as fracture or traumatic subluxation, are evident. Electronically Signed   By: Staci Righter M.D.   On: 11/14/2017 13:51   Ct L-spine No Charge  Result Date: 11/10/2017 CLINICAL DATA:  Back pain.  Fell, with cervical spine trauma. EXAM: CT THORACIC AND LUMBAR SPINE WITHOUT CONTRAST TECHNIQUE: Multidetector CT imaging of the thoracic and lumbar spine was performed without contrast. Multiplanar CT image reconstructions were also generated. COMPARISON:  None. FINDINGS: CT THORACIC SPINE FINDINGS Alignment: Anatomic/physiologic. Vertebrae: No thoracic spine fracture is evident. Paraspinal and other soft tissues: Unremarkable. No pneumothorax or visible mass. See separate CT chest report. Disc levels: Shallow calcific protrusions at T6-7 and T9-10 do  not appear compressive. CT LUMBAR SPINE FINDINGS Segmentation: Standard. Alignment: 4 mm anterolisthesis L3-4.  Trace retrolisthesis L5-S1. Vertebrae: Sclerotic change above and below L3-4 related to chronic disc space narrowing. BILATERAL L3 pars defects. Paraspinal and other soft tissues: Reported under CT abdomen and pelvis separately. Disc levels: L1-L2:  Normal. L2-L3:  Calcified annulus.  Facet arthropathy.  No fracture. L3-L4: 4 mm anterolisthesis. Severe disc space narrowing, near complete collapse. Osseous spurring. No compression fracture. BILATERAL L3 pars defects. Facet arthropathy is superimposed. Moderate stenosis is likely just below the L3-4 interspace. BILATERAL L3 and L4 neural impingement are possible. L4-L5: Unremarkable disc space. Facet arthropathy. No subarticular zone narrowing. BILATERAL foraminal narrowing slightly worse on the LEFT. L5-S1: Osseous spurring. Trace retrolisthesis. Facet arthropathy. Subarticular zone and foraminal zone narrowing could affect the L5 and S1 nerve roots. IMPRESSION: CT THORACIC SPINE IMPRESSION No thoracic vertebral body fracture or traumatic subluxation. Minor disc pathology, unlikely to be compressive. CT LUMBAR SPINE IMPRESSION 4 mm of slip at L3-4 is chronic, related to BILATERAL L3 pars defects. Moderate stenosis is likely at this level. Disc pathology at L4-5 and L5-S1 is suboptimally evaluated on  noncontrast lumbar CT, but could potentially contribute to subarticular zone or foraminal zone narrowing. No posttraumatic sequelae such as fracture or traumatic subluxation, are evident. Electronically Signed   By: Staci Righter M.D.   On: 10/30/2017 13:51   Dg Chest Port 1 View  Result Date: 11/07/2017 CLINICAL DATA:  Pain following fall EXAM: PORTABLE CHEST 1 VIEW COMPARISON:  None. FINDINGS: Lungs are clear. Heart size is upper normal with pulmonary vascularity normal. No adenopathy. No pneumothorax. There are nondisplaced fractures of the  posterolateral right ninth, tenth, and eleventh ribs. There is a suspected incomplete fracture of the lateral right seventh rib. Postoperative change noted in the lower cervical spine region. IMPRESSION: Several nondisplaced rib fractures on the right. No pneumothorax or pleural effusion evident. No edema or consolidation. Heart upper normal in size. Electronically Signed   By: Lowella Grip III M.D.   On: 11/09/2017 11:50    Procedures .Critical Care Performed by: Joanne Gavel, PA-C Authorized by: Joanne Gavel, PA-C   Critical care provider statement:    Critical care time (minutes):  55   Critical care time was exclusive of:  Separately billable procedures and treating other patients and teaching time   Critical care was necessary to treat or prevent imminent or life-threatening deterioration of the following conditions:  Trauma   Critical care was time spent personally by me on the following activities:  Re-evaluation of patient's condition, pulse oximetry, ordering and review of radiographic studies, ordering and review of laboratory studies, ordering and performing treatments and interventions, review of old charts, obtaining history from patient or surrogate, discussions with consultants, development of treatment plan with patient or surrogate, evaluation of patient's response to treatment and examination of patient   I assumed direction of critical care for this patient from another provider in my specialty: no     (including critical care time)  Medications Ordered in ED Medications  0.9 % NaCl with KCl 20 mEq/ L  infusion ( Intravenous New Bag/Given 11/08/2017 1500)  ondansetron (ZOFRAN-ODT) disintegrating tablet 4 mg (has no administration in time range)    Or  ondansetron (ZOFRAN) injection 4 mg (has no administration in time range)  fentaNYL (SUBLIMAZE) injection 25-50 mcg (has no administration in time range)  famotidine (PEPCID) IVPB 20 mg premix (20 mg Intravenous New  Bag/Given 11/02/2017 1504)  hydrALAZINE (APRESOLINE) injection 10 mg (has no administration in time range)  levothyroxine (SYNTHROID, LEVOTHROID) injection 50 mcg (50 mcg Intravenous Given 10/19/2017 1501)  fentaNYL (SUBLIMAZE) injection 75 mcg (75 mcg Intravenous Given 10/27/2017 1222)  nicardipine (CARDENE) 20mg  in 0.86% saline 247ml IV infusion (0.1 mg/ml) (0 mg/hr Intravenous Hold 11/01/2017 1315)     Initial Impression / Assessment and Plan / ED Course  I have reviewed the triage vital signs and the nursing notes.  Pertinent labs & imaging results that were available during my care of the patient were reviewed by me and considered in my medical decision making (see chart for details).     73 year old male with a h/o of HOH, CAD, DM, HTN, CKD, aneurysm of ascending aorta, polyneuropathy brought in by EMS with a chief complaint of fall.  EMS reports the patient fell down 13 steps while using his walker.  On initial exam, the patient is in an [employer], multimedia and is alert.  He has no complaints of pain at this time.  He is protecting his airway.  He is hypertensive at 216/102.  States that he takes blood pressure medication and took  his morning dose.  He does not know the name of any of his medications.  Voice is hoarse.  Follows simple commands and is moving all 4 extremities.  Per the patient's wife after she arrived, she was with the patient until she stepped out of the room.  She then heard a crash several seconds later and found the patient laying at the bottom of the steps.  He was not responsive.  She called EMS and the patient remained not responsive until shortly before EMS arrival.  She reports frequent falls of for the last few months.  He has a known C7 cervical fracture and had an outpatient MRI at [facility] yesterday.  She does not know the name of the patient's neurosurgeon.  She does not know when his next appointment is.  She does not know his entire list of medications, but  provided a list to the pharmacy tech, but knows he takes Plavix.  Per chart review, left heart cath [facility] on 7/19,  which has shown Multivessel CAD. Severe stenosis of the LAD, Ramus Branch, RCA & Circumflex; CT surgery consulted, Dr. Freda Munro has seen the patient but the patient refused and surgery wanted to continue the medical treatment which is okay by the cardiology and CT surgery and so being discharged.  He is a FULL CODE at this time.   CT head with acute intracranial hemorrhage with some areas of parenchymal hemorrhage, subarachnoid and subdural hemorrhage and intraventricular hemorrhage.  No midline shift.  No skull fracture.  CT cervical spine with a C7 left transverse process fracture, which was known.  He has bilateral rib fractures, ribs 7, 8, 9 on the right and ribs 10 and 11 on the left with bibasilar atelectasis.  Cardene infusion initiated for hypertension with a goal systolic rate of less than 160.  75 mcg of fentanyl given for pain control.  Notified by nursing staff after push of fentanyl the patient desatted to 70s on room air, but quickly returned to the 90s.   Labs are notable for creatinine of 2.11, hemoglobin of 12.1, and potassium of 5.1.  No previous results are available comparison at this time as the patient's chart has not been merged and previous labs are not available.  The patient was seen and independently evaluated by Dr. Zenia Resides, attending physician.  Consulted Dr. Christella Noa regarding intracranial hemorrhage who came to evaluate the patient.  Given 2 organ systems in the setting of trauma, trauma surgery was consulted and Dr. Grandville Silos will admit the patient.  Received a call from radiology regarding MRI images from outpatient study yesterday, which demonstrated a significant contusion to the cervical spine and the segment above C7 with spinal cord impingement.  These results were conveyed to Dr. Christella Noa. The patient appears reasonably stabilized for admission considering the  current resources, flow, and capabilities available in the ED at this time, and I doubt any other [facility] requiring further screening and/or treatment in the ED prior to admission.    Final Clinical Impressions(s) / ED Diagnoses   Final diagnoses:  Traumatic subdural hemorrhage with loss of consciousness, initial encounter [facility])  Fall, initial encounter  Closed fracture of multiple ribs of both sides, initial encounter  Intracranial hemorrhage [facility])    ED Discharge Orders    None       Joanne Gavel, PA-C 11/06/2017 Ronni Rumble, MD 11/16/17 [phone]

## 2017-11-16 ENCOUNTER — Inpatient Hospital Stay (HOSPITAL_COMMUNITY): Payer: Medicare Other

## 2017-11-16 LAB — PHOSPHORUS: PHOSPHORUS: 2.9 mg/dL (ref 2.5–4.6)

## 2017-11-16 LAB — BASIC METABOLIC PANEL
Anion gap: 9 (ref 5–15)
Anion gap: 8 (ref 5–15)
BUN: 38 mg/dL — ABNORMAL HIGH (ref 8–23)
BUN: 44 mg/dL — AB (ref 8–23)
CALCIUM: 8.9 mg/dL (ref 8.9–10.3)
CHLORIDE: 106 mmol/L (ref 98–111)
CO2: 23 mmol/L (ref 22–32)
CO2: 25 mmol/L (ref 22–32)
Calcium: 8.7 mg/dL — ABNORMAL LOW (ref 8.9–10.3)
Chloride: 104 mmol/L (ref 98–111)
Creatinine, Ser: 2.24 mg/dL — ABNORMAL HIGH (ref 0.61–1.24)
Creatinine, Ser: 2.4 mg/dL — ABNORMAL HIGH (ref 0.61–1.24)
GFR calc Af Amer: 32 mL/min — ABNORMAL LOW (ref >60)
GFR calc Af Amer: 29 mL/min — ABNORMAL LOW (ref >60)
GFR calc non Af Amer: 28 mL/min — ABNORMAL LOW (ref >60)
GFR, EST NON AFRICAN AMERICAN: 25 mL/min — AB (ref >60)
Glucose, Bld: 235 mg/dL — ABNORMAL HIGH (ref 70–99)
Glucose, Bld: 249 mg/dL — ABNORMAL HIGH (ref 70–99)
POTASSIUM: 5.2 mmol/L — AB (ref 3.5–5.1)
Potassium: 5.1 mmol/L (ref 3.5–5.1)
SODIUM: 139 mmol/L (ref 135–145)
Sodium: 136 mmol/L (ref 135–145)

## 2017-11-16 LAB — GLUCOSE, CAPILLARY
GLUCOSE-CAPILLARY: 290 mg/dL — AB (ref 70–99)
GLUCOSE-CAPILLARY: 283 mg/dL — AB (ref 70–99)
Glucose-Capillary: 268 mg/dL — ABNORMAL HIGH (ref 70–99)
Glucose-Capillary: 236 mg/dL — ABNORMAL HIGH (ref 70–99)

## 2017-11-16 LAB — CBC
HEMATOCRIT: 33.9 % — AB (ref 39.0–52.0)
Hemoglobin: 11 g/dL — ABNORMAL LOW (ref 13.0–17.0)
MCH: 30.4 pg (ref 26.0–34.0)
MCHC: 32.4 g/dL (ref 30.0–36.0)
MCV: 93.6 fL (ref 80.0–100.0)
NRBC: 0 % (ref 0.0–0.2)
Platelets: 133 10*3/uL — ABNORMAL LOW (ref 150–400)
RBC: 3.62 MIL/uL — AB (ref 4.22–5.81)
RDW: 13.5 % (ref 11.5–15.5)
WBC: 10.9 10*3/uL — ABNORMAL HIGH (ref 4.0–10.5)

## 2017-11-16 LAB — CDS SEROLOGY

## 2017-11-16 LAB — ETHANOL: Alcohol, Ethyl (B): <10 mg/dL (ref <10)

## 2017-11-16 LAB — MAGNESIUM: MAGNESIUM: 1.9 mg/dL (ref 1.7–2.4)

## 2017-11-16 MED ORDER — SODIUM CHLORIDE 0.9 % IV SOLN
INTRAVENOUS | Status: DC
  Administered 2017-11-16 – 2017-11-19 (×4): via INTRAVENOUS

## 2017-11-16 MED ORDER — INSULIN ASPART 100 UNIT/ML ~~LOC~~ SOLN
0.0000 [IU] | Freq: Three times a day (TID) | SUBCUTANEOUS | Status: DC
  Administered 2017-11-16: 8 [IU] via SUBCUTANEOUS
  Administered 2017-11-16: 5 [IU] via SUBCUTANEOUS

## 2017-11-16 MED ORDER — ORAL CARE MOUTH RINSE
15.0000 mL | Freq: Two times a day (BID) | OROMUCOSAL | Status: DC
  Administered 2017-11-16 – 2017-11-19 (×7): 15 mL via OROMUCOSAL

## 2017-11-16 MED ORDER — CHLORHEXIDINE GLUCONATE 0.12 % MT SOLN
15.0000 mL | Freq: Two times a day (BID) | OROMUCOSAL | Status: DC
  Administered 2017-11-16 – 2017-11-19 (×7): 15 mL via OROMUCOSAL
  Filled 2017-11-16 (×2): qty 15

## 2017-11-16 MED ORDER — PIVOT 1.5 CAL PO LIQD
1000.0000 mL | ORAL | Status: DC
  Administered 2017-11-16 – 2017-11-19 (×4): 1000 mL

## 2017-11-16 MED ORDER — ACETAMINOPHEN 650 MG RE SUPP
650.0000 mg | RECTAL | Status: DC | PRN
  Administered 2017-11-16 – 2017-11-19 (×5): 650 mg via RECTAL
  Filled 2017-11-16 (×6): qty 1

## 2017-11-16 MED ORDER — INSULIN ASPART 100 UNIT/ML ~~LOC~~ SOLN
0.0000 [IU] | SUBCUTANEOUS | Status: DC
  Administered 2017-11-16 – 2017-11-17 (×2): 8 [IU] via SUBCUTANEOUS
  Administered 2017-11-17: 11 [IU] via SUBCUTANEOUS

## 2017-11-16 NOTE — Progress Notes (Signed)
Initial Nutrition Assessment  DOCUMENTATION CODES:   Not applicable  INTERVENTION:   Initiate enteral nutrition therapy via Cortrak tube Pivot 1.5 @ 50 ml/hr (1200 ml/day)  Provides: 1800 kcal, 112 grams protein, and 910 ml free water.    NUTRITION DIAGNOSIS:   Inadequate oral intake related to lethargy/confusion as evidenced by NPO status.  GOAL:   Patient will meet greater than or equal to 90% of their needs  MONITOR:   TF tolerance, I & O's  REASON FOR ASSESSMENT:   Consult Enteral/tube feeding initiation and management  ASSESSMENT:   Pt with PMH of CAD, CVA, CKD, esophageal cancer s/p treatment, is very HOH, and recent C spine fx admitted as a level 2 trauma after falling down 16 stairs with TBI, SAH, SDH, remote C7 TVP fx, R rib fx 9-11.    Pt discussed during ICU rounds and with RN.   Pt now non-verbal and not following commands. Per RN will place cortrak for meds and nutrition and consider palliative care consult.  11/1 Cortrak placed  Spoke with pt's brother-in-law. He reports multiple falls at home PTA. No recent weight loss known and usually has a great appetite.   Medications reviewed and include: SSI Labs reviewed: K+ 5.2 (H) CBG (last 3)  Recent Labs    11/16/17 1147  GLUCAP 268*     NUTRITION - FOCUSED PHYSICAL EXAM:    Most Recent Value  Orbital Region  No depletion  Upper Arm Region  No depletion  Thoracic and Lumbar Region  No depletion  Buccal Region  No depletion  Temple Region  Mild depletion  Clavicle Bone Region  Moderate depletion  Clavicle and Acromion Bone Region  No depletion  Scapular Bone Region  No depletion  Dorsal Hand  No depletion  Patellar Region  No depletion  Anterior Thigh Region  No depletion  Posterior Calf Region  No depletion  Edema (RD Assessment)  None  Hair  Reviewed  Eyes  Reviewed  Mouth  Reviewed  Skin  Reviewed  Nails  Reviewed       Diet Order:   Diet Order            Diet NPO time  specified  Diet effective now              EDUCATION NEEDS:   No education needs have been identified at this time  Skin:  Skin Assessment: Reviewed RN Assessment  Last BM:  unknown  Height:   Ht Readings from Last 1 Encounters:  10/26/2017 5\' 6"  (1.676 m)    Weight:   Wt Readings from Last 1 Encounters:  10/25/2017 69.9 kg    Ideal Body Weight:  64.5 kg  BMI:  Body mass index is 24.86 kg/m.  Estimated Nutritional Needs:   Kcal:  1700-1900  Protein:  100-115 grams  Fluid:  >1.7 L/day  Maylon Peppers RD, LDN, CNSC [phone] Pager [phone] After Hours Pager

## 2017-11-16 NOTE — Procedures (Signed)
Cortrak  Person Inserting Tube:  Belinda Schlichting, RD Tube Type:  Cortrak - 43 inches Tube Location:  Right nare Initial Placement:  Stomach Secured by: Bridle Technique Used to Measure Tube Placement:  Documented cm marking at nare/ corner of mouth Cortrak Secured At:  71 cm Procedure Comments:  Cortrak Tube Team Note:  Consult received to place a Cortrak feeding tube.   No x-ray is required. RN may begin using tube.   If the tube becomes dislodged please keep the tube and contact the Cortrak team at [email or web address] (password [email or web address]) for replacement.  If after hours and replacement cannot be delayed, place a NG tube and confirm placement with an abdominal x-ray.    Nandan Willems MS, RD, LDN, CNSC [phone] Pager  [phone] Weekend/On-Call Pager      

## 2017-11-16 NOTE — Progress Notes (Signed)
Notified Claiborne Billings, [place] that patient has a noted muscle twitch that has just started to the right arm and shoulder area. Also patient had only voided 400cc for shift. Bladder scan showed <43cc. BMP was ordered. Will continue to monitor patient.

## 2017-11-16 NOTE — Progress Notes (Signed)
Trauma Service Note  Subjective: Patient completely nonverbal.  No distress.  Not acknoledging presence.  No tracking.  [place].  Just lost his son only a few weeks ago.  Objective: Vital signs in last 24 hours: Temp:  [97.5 F (36.4 C)-102.2 F (39 C)] (P) 98.7 F (37.1 C) (11/01 0945) Pulse Rate:  [62-111] 98 (11/01 1000) Resp:  [10-32] 19 (11/01 1000) BP: (103-220)/(52-127) 127/59 (11/01 1000) SpO2:  [89 %-100 %] 98 % (11/01 1000) Weight:  [69.9 kg] 69.9 kg (10/31 1126) Last BM Date: (PTA)  Intake/Output from previous day: 10/31 0701 - 11/01 0700 In: 1134 [I.V.:1084; IV Piggyback:50] Out: 945 [Urine:945] Intake/Output this shift: No intake/output data recorded.  General: Nonverbal.  No distress.  Lungs: Clear, but CXR shows what appears to be a blossoming PNA on the left base.  No PTX  Abd: Benign  Extremities: No changes  Neuro: Not following commnad.  CT head slightly worse, but clinically he is much worse and not responding at a higher cognitive level.  Lab Results: CBC  Recent Labs    10/24/2017 1157 11/16/17 0630  WBC 7.8 10.9*  HGB 12.1* 11.0*  HCT 36.7* 33.9*  PLT 149* 133*   BMET Recent Labs    10/22/2017 1157 11/16/17 0630  NA 138 136  K 5.1 5.2*  CL 100 104  CO2 30 23  GLUCOSE 147* 235*  BUN 33* 38*  CREATININE 2.11* 2.24*  CALCIUM 9.5 8.7*   PT/INR Recent Labs    11/14/2017 1157  LABPROT 13.9  INR 1.08   ABG No results for input(s): PHART, HCO3 in the last 72 hours.  Invalid input(s): PCO2, PO2  Studies/Results: Ct Abdomen Pelvis Wo Contrast  Result Date: 10/26/2017 CLINICAL DATA:  [place] down stairs. EXAM: CT CHEST, ABDOMEN AND PELVIS WITHOUT CONTRAST TECHNIQUE: Multidetector CT imaging of the chest, abdomen and pelvis was performed following the standard protocol without IV contrast. COMPARISON:  PET-CT on 04/25/2006 FINDINGS: CT CHEST FINDINGS Cardiovascular: There is extensive atherosclerotic calcification of the  coronary arteries. Heart size is normal. No pericardial effusion. There is atherosclerotic calcification of the thoracic aorta not associated with aneurysm or evidence for dissection. The noncontrast appearance of the pulmonary arteries is normal. Mediastinum/Nodes: The visualized portion of the thyroid gland has a normal appearance. Esophagus is normal in appearance. No mediastinal, hilar, or axillary adenopathy. Lungs/Pleura: There are dependent changes in the posterior aspects of the lungs bilaterally. The appearance favors atelectasis over contusion. Musculoskeletal: There are acute fractures of the RIGHT 7th, 8th, and 9th ribs. There are acute fractures of the LEFT 10th and 11th ribs. No vertebral fracture. CT ABDOMEN PELVIS FINDINGS Hepatobiliary: The liver is homogeneous without focal lesion or evidence for laceration. Gallbladder is present. Pancreas: Unremarkable. No pancreatic ductal dilatation or surrounding inflammatory changes. Spleen: Normal in size without focal abnormality. Adrenals/Urinary Tract: A 9 millimeter LEFT adrenal adenoma is present. The RIGHT adrenal gland is normal in appearance. The noncontrast appearance of both kidneys is normal. There is no hydronephrosis. The ureters are unremarkable. The bladder and visualized portion of the urethra are normal. Stomach/Bowel: The stomach and small bowel loops are normal in appearance. The loops of colon are normal in appearance. Moderate stool burden in the rectosigmoid colon. The appendix is well seen and has a normal appearance. Vascular/Lymphatic: There is atherosclerotic calcification of the abdominal aorta not associated with aneurysm. No retroperitoneal or mesenteric adenopathy. Reproductive: Prostate is unremarkable. Other: No abdominal wall hernia or abnormality. No abdominopelvic ascites.  Musculoskeletal: There are degenerative changes in the RIGHT hip and in the mid lumbar spine. Bilateral pars defects at L3 are associated with grade 1  anterolisthesis of L3 on L4. See report for the spine, performed on the same day. IMPRESSION: 1. Bilateral rib fractures, ribs 7, 8, 9 on the RIGHT and ribs 10 and 11 on the LEFT cough. 2. Bibasilar atelectasis. No significant contusions or pneumothorax. 3. No evidence for injury of the abdomen/pelvis. 4. Benign LEFT adrenal adenoma. 5. Moderate stool burden. 6.  Aortic atherosclerosis.  (ICD10-I70.0) 7. Pars defects and grade 1 anterolisthesis of L3 on L4. No acute fracture of the spine or pelvis. Electronically Signed   By: Nolon Nations M.D.   On: 11/12/2017 13:56   Ct Head Wo Contrast  Result Date: 11/16/2017 CLINICAL DATA:  Follow-up examination for traumatic brain injury. EXAM: CT HEAD WITHOUT CONTRAST TECHNIQUE: Contiguous axial images were obtained from the base of the skull through the vertex without intravenous contrast. COMPARISON:  Prior CT from 10/30/2017. FINDINGS: Brain: Hemorrhagic contusion centered at the left basal ganglia is a vaulting, perhaps slightly increased in size measuring 2.5 x 2.2 cm. Slightly increased localized vasogenic edema without significant regional mass effect. Additional scattered smaller small volume hemorrhages along the subependymal region of the right lateral ventricle relatively similar. Additional parenchymal hemorrhage at the posterior right parietal region relatively similar measuring 17 x 11 mm (series 3, image 27). Scattered moderate volume subarachnoid hemorrhage involving the bilateral cerebral hemispheres is increased, most notable at the anterior left operculum region as well as the right sylvian fissure (series 3, image 18). Scattered foci of intraventricular hemorrhage seen with blood layering within the occipital horns of both lateral ventricles. Multifocal hemorrhages seen along the falx. Overall, appearance is increased from previous. Scattered small volume hemorrhage seen along the anterior falx. There has been interval development of a 5 mm subdural  hygroma overlying the anterior left frontal convexity, likely reactive. No significant midline shift. No hydrocephalus or ventricular trapping. Basilar cisterns remain patent. Underlying atrophy with chronic small vessel ischemic disease noted. No acute large vessel territory infarct. Scatter remote lacunar infarcts seen involving the bilateral thalami and right basal ganglia. Vascular: No hyperdense vessel. Scattered vascular calcifications noted within the carotid siphons. Skull: Evolving soft tissue contusions at the left and central posterior scalp. Calvarium intact. Sinuses/Orbits: Globes and orbital soft tissues demonstrate no acute finding. Sequelae of chronic sinusitis noted involving the left greater than right maxillary sinuses. Mastoids are clear. Other: None. IMPRESSION: 1. Traumatic brain injury with, similar to slightly increased in size from previous. Localized vasogenic edema about a few these hemorrhages without significant regional mass effect. 2. Scattered moderate volume subarachnoid hemorrhage involving the bilateral cerebral hemispheres, increased from previous. 3. Scattered intraventricular hemorrhage, also increased from previous. No hydrocephalus or ventricular trapping. 4. Interval development of a 5 mm subdural hygroma overlying the anterior left frontal convexity, likely reactive. No significant midline shift or mass effect. 5. Evolving left posterior and central scalp contusions. Electronically Signed   By: Jeannine Boga M.D.   On: 11/16/2017 02:41   Ct Head Wo Contrast  Result Date: 10/21/2017 CLINICAL DATA:  Mechanical fall down 13 stairs. Loss of consciousness. EXAM: CT HEAD WITHOUT CONTRAST CT CERVICAL SPINE WITHOUT CONTRAST TECHNIQUE: Multidetector CT imaging of the head and cervical spine was performed following the standard protocol without intravenous contrast. Multiplanar CT image reconstructions of the cervical spine were also generated. COMPARISON:  11/01/2017.  FINDINGS: CT HEAD FINDINGS Brain: There are  multiple areas of intracranial hemorrhage. There is a parenchymal hemorrhage centered on the left ventricular nuclei, measuring 26 x 18 x 18 mm. There is adjacent less well-defined parenchymal hemorrhage anterior to the base a ganglia and along the anterior left insular cortex. There is a parenchymal hemorrhage in the subcortical white matter of the superior, posterior right frontal lobe, measuring 19 x 12 x 17 mm, with a tiny adjacent focus of subcortical white matter hemorrhage. Parenchymal hemorrhage is seen in the inferior right frontal lobe anterior to the sylvian fissure. There are also small areas of subarachnoid hemorrhage, along the right sylvian fissure, over the anterior left frontal lobe, in the anterior medial frontal lobes adjacent to the interhemispheric fissure and in the superior right parietal lobe. Subdural hemorrhage is noted along the anterior interhemispheric fissure. There is intraventricular hemorrhage, between the septum flu syndrome and in the right lateral ventricle. There is no significant mass effect and no midline shift. No evidence of an ischemic infarct. The ventricles are normal in overall configuration. There is no hydrocephalus. No extra-axial masses. Vascular: No hyperdense vessel or unexpected calcification. Skull: No skull fracture or lesion. Sinuses/Orbits: Globes and orbits are unremarkable. Mild ethmoid and left maxillary sinus mucosal thickening. Small amount of dependent fluid in the left maxillary sinus. Other: None. CT CERVICAL SPINE FINDINGS Alignment: Slight degenerative anterolisthesis of C3-C4, stable. No other spondylolisthesis. Skull base and vertebrae: No acute fracture. Nondisplaced fracture of left C7 transverse process is stable from the prior study. No primary bone lesion or focal pathologic process. Soft tissues and spinal canal: No prevertebral fluid or swelling. No visible canal hematoma. Disc levels: Status post  anterior cervical disc fusion at C6-C7. Anterior fusion plate and fixation screws well-seated. There is mature bone fusing the disc interspace. The appearance is stable. There is spondylotic disc bulging and endplate spurring from P3-A2 through C5-C6. No evidence of an acute disc herniation. Facet degenerative changes are noted most severe on the right at C3-C4. These findings are stable. Upper chest: No acute findings. No mass or adenopathy. Clear lung apices. Other: None. IMPRESSION: HEAD CT 1. There is acute intracranial hemorrhage with areas of parenchymal hemorrhage, subarachnoid and subdural hemorrhage and intraventricular hemorrhage as detailed above. No hydrocephalus. No midline shift. 2. No skull fracture. Critical Value/emergent results were called by telephone at the time of interpretation on 10/19/2017 at 12:09 pm to Dr. Maree Erie [facility] , who verbally acknowledged these results. CERVICAL CT 1. No acute fracture or acute finding. C7 left transverse process fracture noted on the prior study is stable. Electronically Signed   By: Lajean Manes M.D.   On: 10/24/2017 12:13   Ct Chest Wo Contrast  Result Date: 10/28/2017 CLINICAL DATA:  [place] down stairs. EXAM: CT CHEST, ABDOMEN AND PELVIS WITHOUT CONTRAST TECHNIQUE: Multidetector CT imaging of the chest, abdomen and pelvis was performed following the standard protocol without IV contrast. COMPARISON:  PET-CT on 04/25/2006 FINDINGS: CT CHEST FINDINGS Cardiovascular: There is extensive atherosclerotic calcification of the coronary arteries. Heart size is normal. No pericardial effusion. There is atherosclerotic calcification of the thoracic aorta not associated with aneurysm or evidence for dissection. The noncontrast appearance of the pulmonary arteries is normal. Mediastinum/Nodes: The visualized portion of the thyroid gland has a normal appearance. Esophagus is normal in appearance. No mediastinal, hilar, or axillary adenopathy. Lungs/Pleura: There are  dependent changes in the posterior aspects of the lungs bilaterally. The appearance favors atelectasis over contusion. Musculoskeletal: There are acute fractures of the RIGHT 7th, 8th, and  9th ribs. There are acute fractures of the LEFT 10th and 11th ribs. No vertebral fracture. CT ABDOMEN PELVIS FINDINGS Hepatobiliary: The liver is homogeneous without focal lesion or evidence for laceration. Gallbladder is present. Pancreas: Unremarkable. No pancreatic ductal dilatation or surrounding inflammatory changes. Spleen: Normal in size without focal abnormality. Adrenals/Urinary Tract: A 9 millimeter LEFT adrenal adenoma is present. The RIGHT adrenal gland is normal in appearance. The noncontrast appearance of both kidneys is normal. There is no hydronephrosis. The ureters are unremarkable. The bladder and visualized portion of the urethra are normal. Stomach/Bowel: The stomach and small bowel loops are normal in appearance. The loops of colon are normal in appearance. Moderate stool burden in the rectosigmoid colon. The appendix is well seen and has a normal appearance. Vascular/Lymphatic: There is atherosclerotic calcification of the abdominal aorta not associated with aneurysm. No retroperitoneal or mesenteric adenopathy. Reproductive: Prostate is unremarkable. Other: No abdominal wall hernia or abnormality. No abdominopelvic ascites. Musculoskeletal: There are degenerative changes in the RIGHT hip and in the mid lumbar spine. Bilateral pars defects at L3 are associated with grade 1 anterolisthesis of L3 on L4. See report for the spine, performed on the same day. IMPRESSION: 1. Bilateral rib fractures, ribs 7, 8, 9 on the RIGHT and ribs 10 and 11 on the LEFT cough. 2. Bibasilar atelectasis. No significant contusions or pneumothorax. 3. No evidence for injury of the abdomen/pelvis. 4. Benign LEFT adrenal adenoma. 5. Moderate stool burden. 6.  Aortic atherosclerosis.  (ICD10-I70.0) 7. Pars defects and grade 1  anterolisthesis of L3 on L4. No acute fracture of the spine or pelvis. Electronically Signed   By: Nolon Nations M.D.   On: 11/10/2017 13:56   Ct Cervical Spine Wo Contrast  Result Date: 10/30/2017 CLINICAL DATA:  Mechanical fall down 13 stairs. Loss of consciousness. EXAM: CT HEAD WITHOUT CONTRAST CT CERVICAL SPINE WITHOUT CONTRAST TECHNIQUE: Multidetector CT imaging of the head and cervical spine was performed following the standard protocol without intravenous contrast. Multiplanar CT image reconstructions of the cervical spine were also generated. COMPARISON:  11/01/2017. FINDINGS: CT HEAD FINDINGS Brain: There are multiple areas of intracranial hemorrhage. There is a parenchymal hemorrhage centered on the left ventricular nuclei, measuring 26 x 18 x 18 mm. There is adjacent less well-defined parenchymal hemorrhage anterior to the base a ganglia and along the anterior left insular cortex. There is a parenchymal hemorrhage in the subcortical white matter of the superior, posterior right frontal lobe, measuring 19 x 12 x 17 mm, with a tiny adjacent focus of subcortical white matter hemorrhage. Parenchymal hemorrhage is seen in the inferior right frontal lobe anterior to the sylvian fissure. There are also small areas of subarachnoid hemorrhage, along the right sylvian fissure, over the anterior left frontal lobe, in the anterior medial frontal lobes adjacent to the interhemispheric fissure and in the superior right parietal lobe. Subdural hemorrhage is noted along the anterior interhemispheric fissure. There is intraventricular hemorrhage, between the septum flu syndrome and in the right lateral ventricle. There is no significant mass effect and no midline shift. No evidence of an ischemic infarct. The ventricles are normal in overall configuration. There is no hydrocephalus. No extra-axial masses. Vascular: No hyperdense vessel or unexpected calcification. Skull: No skull fracture or lesion.  Sinuses/Orbits: Globes and orbits are unremarkable. Mild ethmoid and left maxillary sinus mucosal thickening. Small amount of dependent fluid in the left maxillary sinus. Other: None. CT CERVICAL SPINE FINDINGS Alignment: Slight degenerative anterolisthesis of C3-C4, stable. No other spondylolisthesis. Skull  base and vertebrae: No acute fracture. Nondisplaced fracture of left C7 transverse process is stable from the prior study. No primary bone lesion or focal pathologic process. Soft tissues and spinal canal: No prevertebral fluid or swelling. No visible canal hematoma. Disc levels: Status post anterior cervical disc fusion at C6-C7. Anterior fusion plate and fixation screws well-seated. There is mature bone fusing the disc interspace. The appearance is stable. There is spondylotic disc bulging and endplate spurring from F7-T0 through C5-C6. No evidence of an acute disc herniation. Facet degenerative changes are noted most severe on the right at C3-C4. These findings are stable. Upper chest: No acute findings. No mass or adenopathy. Clear lung apices. Other: None. IMPRESSION: HEAD CT 1. There is acute intracranial hemorrhage with areas of parenchymal hemorrhage, subarachnoid and subdural hemorrhage and intraventricular hemorrhage as detailed above. No hydrocephalus. No midline shift. 2. No skull fracture. Critical Value/emergent results were called by telephone at the time of interpretation on 10/20/2017 at 12:09 pm to Dr. Maree Erie [facility] , who verbally acknowledged these results. CERVICAL CT 1. No acute fracture or acute finding. C7 left transverse process fracture noted on the prior study is stable. Electronically Signed   By: Lajean Manes M.D.   On: 11/14/2017 12:13   Dg Pelvis Portable  Result Date: 10/30/2017 CLINICAL DATA:  72 year old male status post fall down steps. Level 2 trauma. EXAM: PORTABLE PELVIS 1-2 VIEWS COMPARISON:  None available. FINDINGS: Bone mineralization is within normal limits for age.  Femoral heads are normally located. Proximal femurs appear grossly intact. No pelvis fracture identified. SI joints appear normal. Lower lumbar disc and endplate degeneration. Negative visible abdominal and pelvic visceral contours aside from iliofemoral calcified atherosclerosis. IMPRESSION: No acute fracture or dislocation identified about the pelvis. Electronically Signed   By: Genevie Ann M.D.   On: 10/30/2017 11:50   Ct T-spine No Charge  Result Date: 10/27/2017 CLINICAL DATA:  Back pain.  Fell, with cervical spine trauma. EXAM: CT THORACIC AND LUMBAR SPINE WITHOUT CONTRAST TECHNIQUE: Multidetector CT imaging of the thoracic and lumbar spine was performed without contrast. Multiplanar CT image reconstructions were also generated. COMPARISON:  None. FINDINGS: CT THORACIC SPINE FINDINGS Alignment: Anatomic/physiologic. Vertebrae: No thoracic spine fracture is evident. Paraspinal and other soft tissues: Unremarkable. No pneumothorax or visible mass. See separate CT chest report. Disc levels: Shallow calcific protrusions at T6-7 and T9-10 do not appear compressive. CT LUMBAR SPINE FINDINGS Segmentation: Standard. Alignment: 4 mm anterolisthesis L3-4.  Trace retrolisthesis L5-S1. Vertebrae: Sclerotic change above and below L3-4 related to chronic disc space narrowing. BILATERAL L3 pars defects. Paraspinal and other soft tissues: Reported under CT abdomen and pelvis separately. Disc levels: L1-L2:  Normal. L2-L3:  Calcified annulus.  Facet arthropathy.  No fracture. L3-L4: 4 mm anterolisthesis. Severe disc space narrowing, near complete collapse. Osseous spurring. No compression fracture. BILATERAL L3 pars defects. Facet arthropathy is superimposed. Moderate stenosis is likely just below the L3-4 interspace. BILATERAL L3 and L4 neural impingement are possible. L4-L5: Unremarkable disc space. Facet arthropathy. No subarticular zone narrowing. BILATERAL foraminal narrowing slightly worse on the LEFT. L5-S1: Osseous  spurring. Trace retrolisthesis. Facet arthropathy. Subarticular zone and foraminal zone narrowing could affect the L5 and S1 nerve roots. IMPRESSION: CT THORACIC SPINE IMPRESSION No thoracic vertebral body fracture or traumatic subluxation. Minor disc pathology, unlikely to be compressive. CT LUMBAR SPINE IMPRESSION 4 mm of slip at L3-4 is chronic, related to BILATERAL L3 pars defects. Moderate stenosis is likely at this level. Disc pathology at L4-5  and L5-S1 is suboptimally evaluated on noncontrast lumbar CT, but could potentially contribute to subarticular zone or foraminal zone narrowing. No posttraumatic sequelae such as fracture or traumatic subluxation, are evident. Electronically Signed   By: Staci Righter M.D.   On: 11/07/2017 13:51   Ct L-spine No Charge  Result Date: 10/25/2017 CLINICAL DATA:  Back pain.  Fell, with cervical spine trauma. EXAM: CT THORACIC AND LUMBAR SPINE WITHOUT CONTRAST TECHNIQUE: Multidetector CT imaging of the thoracic and lumbar spine was performed without contrast. Multiplanar CT image reconstructions were also generated. COMPARISON:  None. FINDINGS: CT THORACIC SPINE FINDINGS Alignment: Anatomic/physiologic. Vertebrae: No thoracic spine fracture is evident. Paraspinal and other soft tissues: Unremarkable. No pneumothorax or visible mass. See separate CT chest report. Disc levels: Shallow calcific protrusions at T6-7 and T9-10 do not appear compressive. CT LUMBAR SPINE FINDINGS Segmentation: Standard. Alignment: 4 mm anterolisthesis L3-4.  Trace retrolisthesis L5-S1. Vertebrae: Sclerotic change above and below L3-4 related to chronic disc space narrowing. BILATERAL L3 pars defects. Paraspinal and other soft tissues: Reported under CT abdomen and pelvis separately. Disc levels: L1-L2:  Normal. L2-L3:  Calcified annulus.  Facet arthropathy.  No fracture. L3-L4: 4 mm anterolisthesis. Severe disc space narrowing, near complete collapse. Osseous spurring. No compression fracture.  BILATERAL L3 pars defects. Facet arthropathy is superimposed. Moderate stenosis is likely just below the L3-4 interspace. BILATERAL L3 and L4 neural impingement are possible. L4-L5: Unremarkable disc space. Facet arthropathy. No subarticular zone narrowing. BILATERAL foraminal narrowing slightly worse on the LEFT. L5-S1: Osseous spurring. Trace retrolisthesis. Facet arthropathy. Subarticular zone and foraminal zone narrowing could affect the L5 and S1 nerve roots. IMPRESSION: CT THORACIC SPINE IMPRESSION No thoracic vertebral body fracture or traumatic subluxation. Minor disc pathology, unlikely to be compressive. CT LUMBAR SPINE IMPRESSION 4 mm of slip at L3-4 is chronic, related to BILATERAL L3 pars defects. Moderate stenosis is likely at this level. Disc pathology at L4-5 and L5-S1 is suboptimally evaluated on noncontrast lumbar CT, but could potentially contribute to subarticular zone or foraminal zone narrowing. No posttraumatic sequelae such as fracture or traumatic subluxation, are evident. Electronically Signed   By: Staci Righter M.D.   On: 10/19/2017 13:51   Dg Chest Port 1 View  Result Date: 11/16/2017 CLINICAL DATA:  Right rib fracture. EXAM: PORTABLE CHEST 1 VIEW COMPARISON:  CT 10/24/2017.  Chest x-ray 10/23/2017 FINDINGS: Mediastinum hilar structures are normal. Left base infiltrate. No pleural effusion or pneumothorax. Cervicothoracic spine fusion. Hardware intact. Right lower rib fractures difficult to identify interim best identified by prior CT. Right posterior ninth rib fracture again noted without interim change. IMPRESSION: 1.  New left base infiltrate consistent with pneumonia. 2. Rib fractures are difficult to identify and are best identified on prior studies. Right posterior ninth rib fracture again noted and is unchanged in appearance. No pneumothorax. Electronically Signed   By: Marcello Moores  Register   On: 11/16/2017 09:01   Dg Chest Port 1 View  Result Date: 11/02/2017 CLINICAL DATA:   Pain following fall EXAM: PORTABLE CHEST 1 VIEW COMPARISON:  None. FINDINGS: Lungs are clear. Heart size is upper normal with pulmonary vascularity normal. No adenopathy. No pneumothorax. There are nondisplaced fractures of the posterolateral right ninth, tenth, and eleventh ribs. There is a suspected incomplete fracture of the lateral right seventh rib. Postoperative change noted in the lower cervical spine region. IMPRESSION: Several nondisplaced rib fractures on the right. No pneumothorax or pleural effusion evident. No edema or consolidation. Heart upper normal in size. Electronically Signed  By: Lowella Grip III M.D.   On: 10/17/2017 11:50    Anti-infectives: Anti-infectives (From admission, onward)   None      Assessment/Plan: s/p  Hyper kalemia and renal insufficiency  Cortrak for nutrition May want to consider goals of care sometime in the near future.  LOS: 1 day   Kathryne Eriksson. Dahlia Bailiff, MD, FACS [phone] Trauma Surgeon 11/16/2017

## 2017-11-16 NOTE — Progress Notes (Signed)
Patient ID: Todd Hall, male   DOB: [birth year], 72 y.o.   MRN: [id number] BP (!) 145/65   Pulse (!) 114   Temp 100.3 F (37.9 C) (Axillary)   Resp (!) 22   Ht 5\' 6"  (1.676 m)   Wt 69.9 kg   SpO2 93%   BMI 24.86 kg/m  Expected evolution of intracranial contusions,no mass effect. Basal cisterns are widely patent, no ventricular effacement No response to voice. Breathing comfortably Will respond to noxious stimuli Perrl,  Was responsive yesterday, though he waned since admission.  No recommendations

## 2017-11-16 NOTE — Progress Notes (Signed)
Pt was nonverbal all night but following commands (squeezing hands/ lifting arms against gravity/ wiggling toes). Now pt is not following any commands but withdraws to pain. MD Cabbell notified- no orders at this time. Will continue to monitor.

## 2017-11-16 DEATH — deceased

## 2017-11-17 LAB — BASIC METABOLIC PANEL
Anion gap: 3 — ABNORMAL LOW (ref 5–15)
BUN: 51 mg/dL — ABNORMAL HIGH (ref 8–23)
CHLORIDE: 109 mmol/L (ref 98–111)
CO2: 25 mmol/L (ref 22–32)
CREATININE: 2.1 mg/dL — AB (ref 0.61–1.24)
Calcium: 8.8 mg/dL — ABNORMAL LOW (ref 8.9–10.3)
GFR calc Af Amer: 35 mL/min — ABNORMAL LOW (ref >60)
GFR calc non Af Amer: 30 mL/min — ABNORMAL LOW (ref >60)
Glucose, Bld: 356 mg/dL — ABNORMAL HIGH (ref 70–99)
Potassium: 5.1 mmol/L (ref 3.5–5.1)
SODIUM: 137 mmol/L (ref 135–145)

## 2017-11-17 LAB — GLUCOSE, CAPILLARY
GLUCOSE-CAPILLARY: 305 mg/dL — AB (ref 70–99)
GLUCOSE-CAPILLARY: 278 mg/dL — AB (ref 70–99)
GLUCOSE-CAPILLARY: 274 mg/dL — AB (ref 70–99)
GLUCOSE-CAPILLARY: 261 mg/dL — AB (ref 70–99)
GLUCOSE-CAPILLARY: 258 mg/dL — AB (ref 70–99)
GLUCOSE-CAPILLARY: 213 mg/dL — AB (ref 70–99)
GLUCOSE-CAPILLARY: 140 mg/dL — AB (ref 70–99)
GLUCOSE-CAPILLARY: 181 mg/dL — AB (ref 70–99)
GLUCOSE-CAPILLARY: 169 mg/dL — AB (ref 70–99)
GLUCOSE-CAPILLARY: 132 mg/dL — AB (ref 70–99)
GLUCOSE-CAPILLARY: 183 mg/dL — AB (ref 70–99)
Glucose-Capillary: 139 mg/dL — ABNORMAL HIGH (ref 70–99)
Glucose-Capillary: 150 mg/dL — ABNORMAL HIGH (ref 70–99)
Glucose-Capillary: 151 mg/dL — ABNORMAL HIGH (ref 70–99)
Glucose-Capillary: 179 mg/dL — ABNORMAL HIGH (ref 70–99)
Glucose-Capillary: 188 mg/dL — ABNORMAL HIGH (ref 70–99)
Glucose-Capillary: 247 mg/dL — ABNORMAL HIGH (ref 70–99)
Glucose-Capillary: 269 mg/dL — ABNORMAL HIGH (ref 70–99)

## 2017-11-17 LAB — CBC WITH DIFFERENTIAL/PLATELET
Abs Immature Granulocytes: 0.03 10*3/uL (ref 0.00–0.07)
Basophils Absolute: 0 10*3/uL (ref 0.0–0.1)
Basophils Relative: 0 %
Eosinophils Absolute: 0 10*3/uL (ref 0.0–0.5)
Eosinophils Relative: 0 %
HCT: 34.8 % — ABNORMAL LOW (ref 39.0–52.0)
HEMOGLOBIN: 11 g/dL — AB (ref 13.0–17.0)
Immature Granulocytes: 0 %
Lymphocytes Relative: 8 %
Lymphs Abs: 0.9 10*3/uL (ref 0.7–4.0)
MCH: 29.9 pg (ref 26.0–34.0)
MCHC: 31.6 g/dL (ref 30.0–36.0)
MCV: 94.6 fL (ref 80.0–100.0)
MONOS PCT: 6 %
Monocytes Absolute: 0.7 10*3/uL (ref 0.1–1.0)
NEUTROS PCT: 86 %
NRBC: 0 % (ref 0.0–0.2)
Neutro Abs: 9.6 10*3/uL — ABNORMAL HIGH (ref 1.7–7.7)
Platelets: 138 10*3/uL — ABNORMAL LOW (ref 150–400)
RBC: 3.68 MIL/uL — ABNORMAL LOW (ref 4.22–5.81)
RDW: 13.8 % (ref 11.5–15.5)
WBC MORPHOLOGY: INCREASED
WBC: 11.2 10*3/uL — ABNORMAL HIGH (ref 4.0–10.5)

## 2017-11-17 LAB — MAGNESIUM
MAGNESIUM: 2 mg/dL (ref 1.7–2.4)
Magnesium: 2 mg/dL (ref 1.7–2.4)

## 2017-11-17 LAB — PHOSPHORUS: Phosphorus: 1.6 mg/dL — ABNORMAL LOW (ref 2.5–4.6)

## 2017-11-17 MED ORDER — PIPERACILLIN-TAZOBACTAM 3.375 G IVPB
3.3750 g | Freq: Three times a day (TID) | INTRAVENOUS | Status: DC
  Administered 2017-11-17 – 2017-11-20 (×8): 3.375 g via INTRAVENOUS
  Filled 2017-11-17 (×11): qty 50

## 2017-11-17 MED ORDER — METOPROLOL TARTRATE 25 MG/10 ML ORAL SUSPENSION
50.0000 mg | Freq: Two times a day (BID) | ORAL | Status: DC
  Administered 2017-11-17 – 2017-11-19 (×6): 50 mg
  Filled 2017-11-17 (×6): qty 20

## 2017-11-17 MED ORDER — INSULIN REGULAR(HUMAN) IN NACL 100-0.9 UT/100ML-% IV SOLN
INTRAVENOUS | Status: DC
  Administered 2017-11-17: 1.9 [IU]/h via INTRAVENOUS
  Administered 2017-11-17: 3.2 [IU]/h via INTRAVENOUS
  Administered 2017-11-18: 5.3 [IU]/h via INTRAVENOUS
  Filled 2017-11-17 (×3): qty 100

## 2017-11-17 MED ORDER — ACETAMINOPHEN 160 MG/5ML PO SOLN
650.0000 mg | Freq: Four times a day (QID) | ORAL | Status: DC | PRN
  Administered 2017-11-17 – 2017-11-19 (×6): 650 mg
  Filled 2017-11-17 (×6): qty 20.3

## 2017-11-17 NOTE — Progress Notes (Addendum)
Trauma Service Note  Subjective: Patient is nonverbal. Is now tracking.  Family at the bedside.   Objective: Vital signs in last 24 hours: Temp:  [97.7 F (36.5 C)-102.3 F (39.1 C)] 100.1 F (37.8 C) (11/02 0800) Pulse Rate:  [87-115] 105 (11/02 0600) Resp:  [18-27] 23 (11/02 0600) BP: (104-169)/(56-90) 157/74 (11/02 0600) SpO2:  [93 %-100 %] 96 % (11/02 0600) Last BM Date: (PTA)  Intake/Output from previous day: 11/01 0701 - 11/02 0700 In: 1700.7 [I.V.:1650.7; IV Piggyback:50] Out: 1300 [Urine:1300] Intake/Output this shift: No intake/output data recorded.  General: Nonverbal.  No distress.  Lungs: Clear, but CXR shows what appears to be a blossoming PNA on the left base.  No PTX  Abd: soft, nondistended  Extremities: No changes  Neuro: Not following commands but appears more alert today.   Lab Results: CBC  Recent Labs    11/16/17 0630 11/17/17 0438  WBC 10.9* 11.2*  HGB 11.0* 11.0*  HCT 33.9* 34.8*  PLT 133* 138*   BMET Recent Labs    11/16/17 1632 11/17/17 0438  NA 139 137  K 5.1 5.1  CL 106 109  CO2 25 25  GLUCOSE 249* 356*  BUN 44* 51*  CREATININE 2.40* 2.10*  CALCIUM 8.9 8.8*   PT/INR Recent Labs    11/04/2017 1157  LABPROT 13.9  INR 1.08   ABG No results for input(s): PHART, HCO3 in the last 72 hours.  Invalid input(s): PCO2, PO2  Studies/Results: Ct Abdomen Pelvis Wo Contrast  Result Date: 11/06/2017 CLINICAL DATA:  [place] down stairs. EXAM: CT CHEST, ABDOMEN AND PELVIS WITHOUT CONTRAST TECHNIQUE: Multidetector CT imaging of the chest, abdomen and pelvis was performed following the standard protocol without IV contrast. COMPARISON:  PET-CT on 04/25/2006 FINDINGS: CT CHEST FINDINGS Cardiovascular: There is extensive atherosclerotic calcification of the coronary arteries. Heart size is normal. No pericardial effusion. There is atherosclerotic calcification of the thoracic aorta not associated with aneurysm or evidence for dissection. The  noncontrast appearance of the pulmonary arteries is normal. Mediastinum/Nodes: The visualized portion of the thyroid gland has a normal appearance. Esophagus is normal in appearance. No mediastinal, hilar, or axillary adenopathy. Lungs/Pleura: There are dependent changes in the posterior aspects of the lungs bilaterally. The appearance favors atelectasis over contusion. Musculoskeletal: There are acute fractures of the RIGHT 7th, 8th, and 9th ribs. There are acute fractures of the LEFT 10th and 11th ribs. No vertebral fracture. CT ABDOMEN PELVIS FINDINGS Hepatobiliary: The liver is homogeneous without focal lesion or evidence for laceration. Gallbladder is present. Pancreas: Unremarkable. No pancreatic ductal dilatation or surrounding inflammatory changes. Spleen: Normal in size without focal abnormality. Adrenals/Urinary Tract: A 9 millimeter LEFT adrenal adenoma is present. The RIGHT adrenal gland is normal in appearance. The noncontrast appearance of both kidneys is normal. There is no hydronephrosis. The ureters are unremarkable. The bladder and visualized portion of the urethra are normal. Stomach/Bowel: The stomach and small bowel loops are normal in appearance. The loops of colon are normal in appearance. Moderate stool burden in the rectosigmoid colon. The appendix is well seen and has a normal appearance. Vascular/Lymphatic: There is atherosclerotic calcification of the abdominal aorta not associated with aneurysm. No retroperitoneal or mesenteric adenopathy. Reproductive: Prostate is unremarkable. Other: No abdominal wall hernia or abnormality. No abdominopelvic ascites. Musculoskeletal: There are degenerative changes in the RIGHT hip and in the mid lumbar spine. Bilateral pars defects at L3 are associated with grade 1 anterolisthesis of L3 on L4. See report for the spine, performed on  the same day. IMPRESSION: 1. Bilateral rib fractures, ribs 7, 8, 9 on the RIGHT and ribs 10 and 11 on the LEFT cough. 2.  Bibasilar atelectasis. No significant contusions or pneumothorax. 3. No evidence for injury of the abdomen/pelvis. 4. Benign LEFT adrenal adenoma. 5. Moderate stool burden. 6.  Aortic atherosclerosis.  (ICD10-I70.0) 7. Pars defects and grade 1 anterolisthesis of L3 on L4. No acute fracture of the spine or pelvis. Electronically Signed   By: Nolon Nations M.D.   On: 10/29/2017 13:56   Ct Head Wo Contrast  Result Date: 11/16/2017 CLINICAL DATA:  Follow-up examination for traumatic brain injury. EXAM: CT HEAD WITHOUT CONTRAST TECHNIQUE: Contiguous axial images were obtained from the base of the skull through the vertex without intravenous contrast. COMPARISON:  Prior CT from 11/13/2017. FINDINGS: Brain: Hemorrhagic contusion centered at the left basal ganglia is a vaulting, perhaps slightly increased in size measuring 2.5 x 2.2 cm. Slightly increased localized vasogenic edema without significant regional mass effect. Additional scattered smaller small volume hemorrhages along the subependymal region of the right lateral ventricle relatively similar. Additional parenchymal hemorrhage at the posterior right parietal region relatively similar measuring 17 x 11 mm (series 3, image 27). Scattered moderate volume subarachnoid hemorrhage involving the bilateral cerebral hemispheres is increased, most notable at the anterior left operculum region as well as the right sylvian fissure (series 3, image 18). Scattered foci of intraventricular hemorrhage seen with blood layering within the occipital horns of both lateral ventricles. Multifocal hemorrhages seen along the falx. Overall, appearance is increased from previous. Scattered small volume hemorrhage seen along the anterior falx. There has been interval development of a 5 mm subdural hygroma overlying the anterior left frontal convexity, likely reactive. No significant midline shift. No hydrocephalus or ventricular trapping. Basilar cisterns remain patent. Underlying  atrophy with chronic small vessel ischemic disease noted. No acute large vessel territory infarct. Scatter remote lacunar infarcts seen involving the bilateral thalami and right basal ganglia. Vascular: No hyperdense vessel. Scattered vascular calcifications noted within the carotid siphons. Skull: Evolving soft tissue contusions at the left and central posterior scalp. Calvarium intact. Sinuses/Orbits: Globes and orbital soft tissues demonstrate no acute finding. Sequelae of chronic sinusitis noted involving the left greater than right maxillary sinuses. Mastoids are clear. Other: None. IMPRESSION: 1. Traumatic brain injury with, similar to slightly increased in size from previous. Localized vasogenic edema about a few these hemorrhages without significant regional mass effect. 2. Scattered moderate volume subarachnoid hemorrhage involving the bilateral cerebral hemispheres, increased from previous. 3. Scattered intraventricular hemorrhage, also increased from previous. No hydrocephalus or ventricular trapping. 4. Interval development of a 5 mm subdural hygroma overlying the anterior left frontal convexity, likely reactive. No significant midline shift or mass effect. 5. Evolving left posterior and central scalp contusions. Electronically Signed   By: Jeannine Boga M.D.   On: 11/16/2017 02:41   Ct Head Wo Contrast  Result Date: 11/03/2017 CLINICAL DATA:  Mechanical fall down 13 stairs. Loss of consciousness. EXAM: CT HEAD WITHOUT CONTRAST CT CERVICAL SPINE WITHOUT CONTRAST TECHNIQUE: Multidetector CT imaging of the head and cervical spine was performed following the standard protocol without intravenous contrast. Multiplanar CT image reconstructions of the cervical spine were also generated. COMPARISON:  11/01/2017. FINDINGS: CT HEAD FINDINGS Brain: There are multiple areas of intracranial hemorrhage. There is a parenchymal hemorrhage centered on the left ventricular nuclei, measuring 26 x 18 x 18 mm.  There is adjacent less well-defined parenchymal hemorrhage anterior to the base a ganglia and  along the anterior left insular cortex. There is a parenchymal hemorrhage in the subcortical Lillar Bianca matter of the superior, posterior right frontal lobe, measuring 19 x 12 x 17 mm, with a tiny adjacent focus of subcortical Kathleene Bergemann matter hemorrhage. Parenchymal hemorrhage is seen in the inferior right frontal lobe anterior to the sylvian fissure. There are also small areas of subarachnoid hemorrhage, along the right sylvian fissure, over the anterior left frontal lobe, in the anterior medial frontal lobes adjacent to the interhemispheric fissure and in the superior right parietal lobe. Subdural hemorrhage is noted along the anterior interhemispheric fissure. There is intraventricular hemorrhage, between the septum flu syndrome and in the right lateral ventricle. There is no significant mass effect and no midline shift. No evidence of an ischemic infarct. The ventricles are normal in overall configuration. There is no hydrocephalus. No extra-axial masses. Vascular: No hyperdense vessel or unexpected calcification. Skull: No skull fracture or lesion. Sinuses/Orbits: Globes and orbits are unremarkable. Mild ethmoid and left maxillary sinus mucosal thickening. Small amount of dependent fluid in the left maxillary sinus. Other: None. CT CERVICAL SPINE FINDINGS Alignment: Slight degenerative anterolisthesis of C3-C4, stable. No other spondylolisthesis. Skull base and vertebrae: No acute fracture. Nondisplaced fracture of left C7 transverse process is stable from the prior study. No primary bone lesion or focal pathologic process. Soft tissues and spinal canal: No prevertebral fluid or swelling. No visible canal hematoma. Disc levels: Status post anterior cervical disc fusion at C6-C7. Anterior fusion plate and fixation screws well-seated. There is mature bone fusing the disc interspace. The appearance is stable. There is spondylotic  disc bulging and endplate spurring from D1-V6 through C5-C6. No evidence of an acute disc herniation. Facet degenerative changes are noted most severe on the right at C3-C4. These findings are stable. Upper chest: No acute findings. No mass or adenopathy. Clear lung apices. Other: None. IMPRESSION: HEAD CT 1. There is acute intracranial hemorrhage with areas of parenchymal hemorrhage, subarachnoid and subdural hemorrhage and intraventricular hemorrhage as detailed above. No hydrocephalus. No midline shift. 2. No skull fracture. Critical Value/emergent results were called by telephone at the time of interpretation on 11/13/2017 at 12:09 pm to Dr. Maree E[facility] , who verbally acknowledged these results. CERVICAL CT 1. No acute fracture or acute finding. C7 left transverse process fracture noted on the prior study is stable. Electronically Signed   By: Lajean Manes M.D.   On: 11/06/2017 12:13   Ct Chest Wo Contrast  Result Date: 10/27/2017 CLINICAL DATA:  [place] down stairs. EXAM: CT CHEST, ABDOMEN AND PELVIS WITHOUT CONTRAST TECHNIQUE: Multidetector CT imaging of the chest, abdomen and pelvis was performed following the standard protocol without IV contrast. COMPARISON:  PET-CT on 04/25/2006 FINDINGS: CT CHEST FINDINGS Cardiovascular: There is extensive atherosclerotic calcification of the coronary arteries. Heart size is normal. No pericardial effusion. There is atherosclerotic calcification of the thoracic aorta not associated with aneurysm or evidence for dissection. The noncontrast appearance of the pulmonary arteries is normal. Mediastinum/Nodes: The visualized portion of the thyroid gland has a normal appearance. Esophagus is normal in appearance. No mediastinal, hilar, or axillary adenopathy. Lungs/Pleura: There are dependent changes in the posterior aspects of the lungs bilaterally. The appearance favors atelectasis over contusion. Musculoskeletal: There are acute fractures of the RIGHT 7th, 8th, and 9th  ribs. There are acute fractures of the LEFT 10th and 11th ribs. No vertebral fracture. CT ABDOMEN PELVIS FINDINGS Hepatobiliary: The liver is homogeneous without focal lesion or evidence for laceration. Gallbladder is present. Pancreas: Unremarkable.  No pancreatic ductal dilatation or surrounding inflammatory changes. Spleen: Normal in size without focal abnormality. Adrenals/Urinary Tract: A 9 millimeter LEFT adrenal adenoma is present. The RIGHT adrenal gland is normal in appearance. The noncontrast appearance of both kidneys is normal. There is no hydronephrosis. The ureters are unremarkable. The bladder and visualized portion of the urethra are normal. Stomach/Bowel: The stomach and small bowel loops are normal in appearance. The loops of colon are normal in appearance. Moderate stool burden in the rectosigmoid colon. The appendix is well seen and has a normal appearance. Vascular/Lymphatic: There is atherosclerotic calcification of the abdominal aorta not associated with aneurysm. No retroperitoneal or mesenteric adenopathy. Reproductive: Prostate is unremarkable. Other: No abdominal wall hernia or abnormality. No abdominopelvic ascites. Musculoskeletal: There are degenerative changes in the RIGHT hip and in the mid lumbar spine. Bilateral pars defects at L3 are associated with grade 1 anterolisthesis of L3 on L4. See report for the spine, performed on the same day. IMPRESSION: 1. Bilateral rib fractures, ribs 7, 8, 9 on the RIGHT and ribs 10 and 11 on the LEFT cough. 2. Bibasilar atelectasis. No significant contusions or pneumothorax. 3. No evidence for injury of the abdomen/pelvis. 4. Benign LEFT adrenal adenoma. 5. Moderate stool burden. 6.  Aortic atherosclerosis.  (ICD10-I70.0) 7. Pars defects and grade 1 anterolisthesis of L3 on L4. No acute fracture of the spine or pelvis. Electronically Signed   By: Nolon Nations M.D.   On: 10/28/2017 13:56   Ct Cervical Spine Wo Contrast  Result Date:  10/29/2017 CLINICAL DATA:  Mechanical fall down 13 stairs. Loss of consciousness. EXAM: CT HEAD WITHOUT CONTRAST CT CERVICAL SPINE WITHOUT CONTRAST TECHNIQUE: Multidetector CT imaging of the head and cervical spine was performed following the standard protocol without intravenous contrast. Multiplanar CT image reconstructions of the cervical spine were also generated. COMPARISON:  11/01/2017. FINDINGS: CT HEAD FINDINGS Brain: There are multiple areas of intracranial hemorrhage. There is a parenchymal hemorrhage centered on the left ventricular nuclei, measuring 26 x 18 x 18 mm. There is adjacent less well-defined parenchymal hemorrhage anterior to the base a ganglia and along the anterior left insular cortex. There is a parenchymal hemorrhage in the subcortical Charan Prieto matter of the superior, posterior right frontal lobe, measuring 19 x 12 x 17 mm, with a tiny adjacent focus of subcortical Ayla Dunigan matter hemorrhage. Parenchymal hemorrhage is seen in the inferior right frontal lobe anterior to the sylvian fissure. There are also small areas of subarachnoid hemorrhage, along the right sylvian fissure, over the anterior left frontal lobe, in the anterior medial frontal lobes adjacent to the interhemispheric fissure and in the superior right parietal lobe. Subdural hemorrhage is noted along the anterior interhemispheric fissure. There is intraventricular hemorrhage, between the septum flu syndrome and in the right lateral ventricle. There is no significant mass effect and no midline shift. No evidence of an ischemic infarct. The ventricles are normal in overall configuration. There is no hydrocephalus. No extra-axial masses. Vascular: No hyperdense vessel or unexpected calcification. Skull: No skull fracture or lesion. Sinuses/Orbits: Globes and orbits are unremarkable. Mild ethmoid and left maxillary sinus mucosal thickening. Small amount of dependent fluid in the left maxillary sinus. Other: None. CT CERVICAL SPINE  FINDINGS Alignment: Slight degenerative anterolisthesis of C3-C4, stable. No other spondylolisthesis. Skull base and vertebrae: No acute fracture. Nondisplaced fracture of left C7 transverse process is stable from the prior study. No primary bone lesion or focal pathologic process. Soft tissues and spinal canal: No prevertebral fluid or swelling.  No visible canal hematoma. Disc levels: Status post anterior cervical disc fusion at C6-C7. Anterior fusion plate and fixation screws well-seated. There is mature bone fusing the disc interspace. The appearance is stable. There is spondylotic disc bulging and endplate spurring from O3-Z8 through C5-C6. No evidence of an acute disc herniation. Facet degenerative changes are noted most severe on the right at C3-C4. These findings are stable. Upper chest: No acute findings. No mass or adenopathy. Clear lung apices. Other: None. IMPRESSION: HEAD CT 1. There is acute intracranial hemorrhage with areas of parenchymal hemorrhage, subarachnoid and subdural hemorrhage and intraventricular hemorrhage as detailed above. No hydrocephalus. No midline shift. 2. No skull fracture. Critical Value/emergent results were called by telephone at the time of interpretation on 11/02/2017 at 12:09 pm to Dr. Maree Erie [facility] , who verbally acknowledged these results. CERVICAL CT 1. No acute fracture or acute finding. C7 left transverse process fracture noted on the prior study is stable. Electronically Signed   By: Lajean Manes M.D.   On: 10/29/2017 12:13   Dg Pelvis Portable  Result Date: 10/20/2017 CLINICAL DATA:  73 year old male status post fall down steps. Level 2 trauma. EXAM: PORTABLE PELVIS 1-2 VIEWS COMPARISON:  None available. FINDINGS: Bone mineralization is within normal limits for age. Femoral heads are normally located. Proximal femurs appear grossly intact. No pelvis fracture identified. SI joints appear normal. Lower lumbar disc and endplate degeneration. Negative visible  abdominal and pelvic visceral contours aside from iliofemoral calcified atherosclerosis. IMPRESSION: No acute fracture or dislocation identified about the pelvis. Electronically Signed   By: Genevie Ann M.D.   On: 11/05/2017 11:50   Ct T-spine No Charge  Result Date: 11/01/2017 CLINICAL DATA:  Back pain.  Fell, with cervical spine trauma. EXAM: CT THORACIC AND LUMBAR SPINE WITHOUT CONTRAST TECHNIQUE: Multidetector CT imaging of the thoracic and lumbar spine was performed without contrast. Multiplanar CT image reconstructions were also generated. COMPARISON:  None. FINDINGS: CT THORACIC SPINE FINDINGS Alignment: Anatomic/physiologic. Vertebrae: No thoracic spine fracture is evident. Paraspinal and other soft tissues: Unremarkable. No pneumothorax or visible mass. See separate CT chest report. Disc levels: Shallow calcific protrusions at T6-7 and T9-10 do not appear compressive. CT LUMBAR SPINE FINDINGS Segmentation: Standard. Alignment: 4 mm anterolisthesis L3-4.  Trace retrolisthesis L5-S1. Vertebrae: Sclerotic change above and below L3-4 related to chronic disc space narrowing. BILATERAL L3 pars defects. Paraspinal and other soft tissues: Reported under CT abdomen and pelvis separately. Disc levels: L1-L2:  Normal. L2-L3:  Calcified annulus.  Facet arthropathy.  No fracture. L3-L4: 4 mm anterolisthesis. Severe disc space narrowing, near complete collapse. Osseous spurring. No compression fracture. BILATERAL L3 pars defects. Facet arthropathy is superimposed. Moderate stenosis is likely just below the L3-4 interspace. BILATERAL L3 and L4 neural impingement are possible. L4-L5: Unremarkable disc space. Facet arthropathy. No subarticular zone narrowing. BILATERAL foraminal narrowing slightly worse on the LEFT. L5-S1: Osseous spurring. Trace retrolisthesis. Facet arthropathy. Subarticular zone and foraminal zone narrowing could affect the L5 and S1 nerve roots. IMPRESSION: CT THORACIC SPINE IMPRESSION No thoracic  vertebral body fracture or traumatic subluxation. Minor disc pathology, unlikely to be compressive. CT LUMBAR SPINE IMPRESSION 4 mm of slip at L3-4 is chronic, related to BILATERAL L3 pars defects. Moderate stenosis is likely at this level. Disc pathology at L4-5 and L5-S1 is suboptimally evaluated on noncontrast lumbar CT, but could potentially contribute to subarticular zone or foraminal zone narrowing. No posttraumatic sequelae such as fracture or traumatic subluxation, are evident. Electronically Signed   By: Jenny Reichmann  Alfonse Flavors M.D.   On: 10/24/2017 13:51   Ct L-spine No Charge  Result Date: 11/12/2017 CLINICAL DATA:  Back pain.  Fell, with cervical spine trauma. EXAM: CT THORACIC AND LUMBAR SPINE WITHOUT CONTRAST TECHNIQUE: Multidetector CT imaging of the thoracic and lumbar spine was performed without contrast. Multiplanar CT image reconstructions were also generated. COMPARISON:  None. FINDINGS: CT THORACIC SPINE FINDINGS Alignment: Anatomic/physiologic. Vertebrae: No thoracic spine fracture is evident. Paraspinal and other soft tissues: Unremarkable. No pneumothorax or visible mass. See separate CT chest report. Disc levels: Shallow calcific protrusions at T6-7 and T9-10 do not appear compressive. CT LUMBAR SPINE FINDINGS Segmentation: Standard. Alignment: 4 mm anterolisthesis L3-4.  Trace retrolisthesis L5-S1. Vertebrae: Sclerotic change above and below L3-4 related to chronic disc space narrowing. BILATERAL L3 pars defects. Paraspinal and other soft tissues: Reported under CT abdomen and pelvis separately. Disc levels: L1-L2:  Normal. L2-L3:  Calcified annulus.  Facet arthropathy.  No fracture. L3-L4: 4 mm anterolisthesis. Severe disc space narrowing, near complete collapse. Osseous spurring. No compression fracture. BILATERAL L3 pars defects. Facet arthropathy is superimposed. Moderate stenosis is likely just below the L3-4 interspace. BILATERAL L3 and L4 neural impingement are possible. L4-L5:  Unremarkable disc space. Facet arthropathy. No subarticular zone narrowing. BILATERAL foraminal narrowing slightly worse on the LEFT. L5-S1: Osseous spurring. Trace retrolisthesis. Facet arthropathy. Subarticular zone and foraminal zone narrowing could affect the L5 and S1 nerve roots. IMPRESSION: CT THORACIC SPINE IMPRESSION No thoracic vertebral body fracture or traumatic subluxation. Minor disc pathology, unlikely to be compressive. CT LUMBAR SPINE IMPRESSION 4 mm of slip at L3-4 is chronic, related to BILATERAL L3 pars defects. Moderate stenosis is likely at this level. Disc pathology at L4-5 and L5-S1 is suboptimally evaluated on noncontrast lumbar CT, but could potentially contribute to subarticular zone or foraminal zone narrowing. No posttraumatic sequelae such as fracture or traumatic subluxation, are evident. Electronically Signed   By: Staci Righter M.D.   On: 10/29/2017 13:51   Dg Chest Port 1 View  Result Date: 11/16/2017 CLINICAL DATA:  Right rib fracture. EXAM: PORTABLE CHEST 1 VIEW COMPARISON:  CT 11/11/2017.  Chest x-ray 11/12/2017 FINDINGS: Mediastinum hilar structures are normal. Left base infiltrate. No pleural effusion or pneumothorax. Cervicothoracic spine fusion. Hardware intact. Right lower rib fractures difficult to identify interim best identified by prior CT. Right posterior ninth rib fracture again noted without interim change. IMPRESSION: 1.  New left base infiltrate consistent with pneumonia. 2. Rib fractures are difficult to identify and are best identified on prior studies. Right posterior ninth rib fracture again noted and is unchanged in appearance. No pneumothorax. Electronically Signed   By: Marcello Moores  Register   On: 11/16/2017 09:01   Dg Chest Port 1 View  Result Date: 11/04/2017 CLINICAL DATA:  Pain following fall EXAM: PORTABLE CHEST 1 VIEW COMPARISON:  None. FINDINGS: Lungs are clear. Heart size is upper normal with pulmonary vascularity normal. No adenopathy. No  pneumothorax. There are nondisplaced fractures of the posterolateral right ninth, tenth, and eleventh ribs. There is a suspected incomplete fracture of the lateral right seventh rib. Postoperative change noted in the lower cervical spine region. IMPRESSION: Several nondisplaced rib fractures on the right. No pneumothorax or pleural effusion evident. No edema or consolidation. Heart upper normal in size. Electronically Signed   By: Lowella Grip III M.D.   On: 11/13/2017 11:50    Anti-infectives: Anti-infectives (From admission, onward)   None      Assessment/Plan: s/p  Hyper kalemia and renal insufficiency -  now stable/improving Cortrak for nutrition CXR yesterday showed new infiltrate L lung base - given intermittent fevers, will empirically start Zosyn - dosing per pharmacy given underlying renal insufficiency NSGY following - appreciate assistance in his care Will consider goals of care sometime in the near future.  LOS: 2 days    Sharon Mt. Dema Severin, M.D. [facility]

## 2017-11-17 NOTE — Progress Notes (Signed)
Pharmacy Antibiotic Note  Todd Hall is a 72 y.o. male admitted on 10/28/2017 after falling down the stairs. Patient with intermittent fevers to tmax 102.3, CXR with new L lung base infiltrate c/w PNA. Pharmacy has been consulted for Zosyn dosing. WBC up slightly to 11.2, current temp 100.6. Scr 2.10 (BL unknown), estimated CrCl ~29 mL/min.  Plan: Zosyn 3.375g IV q8h F/u clinical status, C&S, renal function, LOT, de-escalation  Height: 5\' 6"  (167.6 cm) Weight: 154 lb (69.9 kg) IBW/kg (Calculated) : 63.8  Temp (24hrs), Avg:100.1 F (37.8 C), Min:97.7 F (36.5 C), Max:102.3 F (39.1 C)  Recent Labs  Lab 11/08/2017 1140 10/19/2017 1141 11/11/2017 1157 11/16/17 0630 11/16/17 1632 11/17/17 0438  WBC  --   --  7.8 10.9*  --  11.2*  CREATININE 2.20*  --  2.11* 2.24* 2.40* 2.10*  LATICACIDVEN  --  1.00  --   --   --   --     Estimated Creatinine Clearance: 28.7 mL/min (A) (by C-G formula based on SCr of 2.1 mg/dL (H)).    No Known Allergies  Antimicrobials this admission: Zosyn 11/2 >>  Microbiology results: 10/31 MRSA PCR: negative  Thank you for allowing pharmacy to be a part of this patient's care.  [name], PharmD PGY2 Infectious Diseases Pharmacy Resident Phone: [phone] 11/17/2017 3:45 PM

## 2017-11-17 NOTE — Progress Notes (Signed)
  NEUROSURGERY PROGRESS NOTE   No issues overnight.   EXAM:  BP (!) 157/74   Pulse (!) 105   Temp 100.1 F (37.8 C) (Axillary)   Resp (!) 23   Ht 5\' 6"  (1.676 m)   Wt 69.9 kg   SpO2 96%   BMI 24.86 kg/m   Eyes open spontaneously Tracks Non-verbal Not following commands, will respond to pain  IMPRESSION:  72 y.o. male s/p fall with severe TBI and diffuse ICH  PLAN: - Cont current mgmt

## 2017-11-18 ENCOUNTER — Inpatient Hospital Stay (HOSPITAL_COMMUNITY): Payer: Medicare Other

## 2017-11-18 LAB — CBC WITH DIFFERENTIAL/PLATELET
Abs Immature Granulocytes: 0.08 10*3/uL — ABNORMAL HIGH (ref 0.00–0.07)
Basophils Absolute: 0 10*3/uL (ref 0.0–0.1)
Basophils Relative: 0 %
EOS PCT: 0 %
Eosinophils Absolute: 0 10*3/uL (ref 0.0–0.5)
HCT: 33.8 % — ABNORMAL LOW (ref 39.0–52.0)
HEMOGLOBIN: 11 g/dL — AB (ref 13.0–17.0)
Immature Granulocytes: 1 %
Lymphocytes Relative: 9 %
Lymphs Abs: 0.9 10*3/uL (ref 0.7–4.0)
MCH: 30.5 pg (ref 26.0–34.0)
MCHC: 32.5 g/dL (ref 30.0–36.0)
MCV: 93.6 fL (ref 80.0–100.0)
MONO ABS: 1 10*3/uL (ref 0.1–1.0)
Monocytes Relative: 10 %
NEUTROS ABS: 8.1 10*3/uL — AB (ref 1.7–7.7)
Neutrophils Relative %: 80 %
Platelets: 125 10*3/uL — ABNORMAL LOW (ref 150–400)
RBC: 3.61 MIL/uL — AB (ref 4.22–5.81)
RDW: 14.2 % (ref 11.5–15.5)
WBC: 10.1 10*3/uL (ref 4.0–10.5)
nRBC: 0 % (ref 0.0–0.2)

## 2017-11-18 LAB — GLUCOSE, CAPILLARY
GLUCOSE-CAPILLARY: 201 mg/dL — AB (ref 70–99)
GLUCOSE-CAPILLARY: 128 mg/dL — AB (ref 70–99)
GLUCOSE-CAPILLARY: 145 mg/dL — AB (ref 70–99)
GLUCOSE-CAPILLARY: 159 mg/dL — AB (ref 70–99)
GLUCOSE-CAPILLARY: 154 mg/dL — AB (ref 70–99)
GLUCOSE-CAPILLARY: 151 mg/dL — AB (ref 70–99)
GLUCOSE-CAPILLARY: 194 mg/dL — AB (ref 70–99)
GLUCOSE-CAPILLARY: 166 mg/dL — AB (ref 70–99)
GLUCOSE-CAPILLARY: 145 mg/dL — AB (ref 70–99)
GLUCOSE-CAPILLARY: 148 mg/dL — AB (ref 70–99)
GLUCOSE-CAPILLARY: 184 mg/dL — AB (ref 70–99)
GLUCOSE-CAPILLARY: 166 mg/dL — AB (ref 70–99)
GLUCOSE-CAPILLARY: 181 mg/dL — AB (ref 70–99)
Glucose-Capillary: 127 mg/dL — ABNORMAL HIGH (ref 70–99)
Glucose-Capillary: 134 mg/dL — ABNORMAL HIGH (ref 70–99)
Glucose-Capillary: 135 mg/dL — ABNORMAL HIGH (ref 70–99)
Glucose-Capillary: 141 mg/dL — ABNORMAL HIGH (ref 70–99)
Glucose-Capillary: 145 mg/dL — ABNORMAL HIGH (ref 70–99)
Glucose-Capillary: 151 mg/dL — ABNORMAL HIGH (ref 70–99)
Glucose-Capillary: 152 mg/dL — ABNORMAL HIGH (ref 70–99)
Glucose-Capillary: 160 mg/dL — ABNORMAL HIGH (ref 70–99)
Glucose-Capillary: 161 mg/dL — ABNORMAL HIGH (ref 70–99)
Glucose-Capillary: 176 mg/dL — ABNORMAL HIGH (ref 70–99)

## 2017-11-18 LAB — PHOSPHORUS
Phosphorus: <1 mg/dL — CL (ref 2.5–4.6)
Phosphorus: 1.5 mg/dL — ABNORMAL LOW (ref 2.5–4.6)

## 2017-11-18 LAB — BASIC METABOLIC PANEL
Anion gap: 4 — ABNORMAL LOW (ref 5–15)
BUN: 55 mg/dL — ABNORMAL HIGH (ref 8–23)
CHLORIDE: 117 mmol/L — AB (ref 98–111)
CO2: 24 mmol/L (ref 22–32)
CREATININE: 2.19 mg/dL — AB (ref 0.61–1.24)
Calcium: 8.7 mg/dL — ABNORMAL LOW (ref 8.9–10.3)
GFR calc Af Amer: 33 mL/min — ABNORMAL LOW (ref >60)
GFR calc non Af Amer: 28 mL/min — ABNORMAL LOW (ref >60)
Glucose, Bld: 160 mg/dL — ABNORMAL HIGH (ref 70–99)
Potassium: 4.7 mmol/L (ref 3.5–5.1)
Sodium: 145 mmol/L (ref 135–145)

## 2017-11-18 LAB — MAGNESIUM: Magnesium: 2.3 mg/dL (ref 1.7–2.4)

## 2017-11-18 MED ORDER — INSULIN GLARGINE 100 UNIT/ML ~~LOC~~ SOLN
33.0000 [IU] | Freq: Every day | SUBCUTANEOUS | Status: DC
  Administered 2017-11-18 – 2017-11-19 (×2): 33 [IU] via SUBCUTANEOUS
  Filled 2017-11-18 (×4): qty 0.33

## 2017-11-18 MED ORDER — FENTANYL CITRATE (PF) 100 MCG/2ML IJ SOLN: 12.5000 ug | INTRAMUSCULAR | Status: DC | PRN

## 2017-11-18 MED ORDER — INSULIN ASPART 100 UNIT/ML ~~LOC~~ SOLN
0.0000 [IU] | SUBCUTANEOUS | Status: DC
  Administered 2017-11-19: 1 [IU] via SUBCUTANEOUS
  Administered 2017-11-19: 7 [IU] via SUBCUTANEOUS
  Administered 2017-11-19: 3 [IU] via SUBCUTANEOUS

## 2017-11-18 MED ORDER — VANCOMYCIN HCL IN DEXTROSE 750-5 MG/150ML-% IV SOLN
750.0000 mg | INTRAVENOUS | Status: DC
  Administered 2017-11-19: 750 mg via INTRAVENOUS
  Filled 2017-11-18 (×2): qty 150

## 2017-11-18 MED ORDER — VANCOMYCIN HCL 10 G IV SOLR
1500.0000 mg | Freq: Once | INTRAVENOUS | Status: AC
  Administered 2017-11-18: 1500 mg via INTRAVENOUS
  Filled 2017-11-18: qty 1500

## 2017-11-18 NOTE — Progress Notes (Signed)
  NEUROSURGERY PROGRESS NOTE   No issues overnight. Pt noted to be more lethargic today. Significant respiratory secretions.  EXAM:  BP (!) 149/79 (BP Location: Right Arm)   Pulse (!) 113   Temp (!) 100.8 F (38.2 C) (Axillary)   Resp (!) 31   Ht 5\' 6"  (1.676 m)   Wt 69.9 kg   SpO2 95%   BMI 24.86 kg/m   No spontaneously eye opening Non-verbal Not following commands, minimal w/d to pain in BLE  IMPRESSION:  72 y.o. male s/p severe TBI and superimposed pneumonia. Prognosis for meaningful neurologic recovery given his diffuse injury, age, and medical comorbidities I think is likely poor.  PLAN: - Cont supportive care.   I did review the situation with the patient's family at bedside. Told them that I suspect his prognosis from a neurologic standpoint is likely poor. Options if he declines from a respiratory standpoint would be to intubate and monitor for any neurologic improvement over the next several days, vs electing not to intubate and transitioning to more comfort-based care. All their questions were answered.

## 2017-11-18 NOTE — Progress Notes (Signed)
Pharmacy Antibiotic Note  Todd Hall is a 72 y.o. male admitted on 10/26/2017 after falling down the stairs. Patient with intermittent fevers to tmax 102.3, CXR with new L lung base infiltrate c/w PNA. Started on Zosyn 11/2 and pharmacy now consulted for vancomycin dosing. WBC down to 10.1 WNL, current temp 100.8. Scr 2.19 (BL unknown), estimated CrCl ~28 mL/min.  Plan: Continue Zosyn 3.375g IV q8h Give vancomycin 1500mg  IV x1, followed by 750mg  IV q24h F/u clinical status, C&S, renal function, LOT, de-escalation, vancomycin levels as appropriate  Height: 5\' 6"  (167.6 cm) Weight: 154 lb (69.9 kg) IBW/kg (Calculated) : 63.8  Temp (24hrs), Avg:100.7 F (38.2 C), Min:99.6 F (37.6 C), Max:102 F (38.9 C)  Recent Labs  Lab 11/11/2017 1141 10/25/2017 1157 11/16/17 0630 11/16/17 1632 11/17/17 0438 11/18/17 0620  WBC  --  7.8 10.9*  --  11.2* 10.1  CREATININE  --  2.11* 2.24* 2.40* 2.10* 2.19*  LATICACIDVEN 1.00  --   --   --   --   --     Estimated Creatinine Clearance: 27.5 mL/min (A) (by C-G formula based on SCr of 2.19 mg/dL (H)).    No Known Allergies  Antimicrobials this admission: Zosyn 11/2 >> Vancomycin 11/3 >>  Microbiology results: 10/31 MRSA PCR: negative  Thank you for allowing pharmacy to be a part of this patient's care.  [name], PharmD PGY2 Infectious Diseases Pharmacy Resident Phone: [phone] 11/18/2017 11:14 AM

## 2017-11-18 NOTE — Progress Notes (Addendum)
   Subjective/Chief Complaint: Nonverbal, no real following commands, family here   Objective: Vital signs in last 24 hours: Temp:  [99.6 F (37.6 C)-102 F (38.9 C)] 100.8 F (38.2 C) (11/03 0800) Pulse Rate:  [79-118] 113 (11/03 0800) Resp:  [28-38] 31 (11/03 0800) BP: (109-160)/(61-87) 149/79 (11/03 0800) SpO2:  [92 %-98 %] 95 % (11/03 0800) Last BM Date: (PTA)  Intake/Output from previous day: 11/02 0701 - 11/03 0700 In: 3464.6 [I.V.:1370; MG/NO:0370.4; IV Piggyback:162.1] Out: 2025 [Urine:2025] Intake/Output this shift: Total I/O In: 192.3 [I.V.:79.8; NG/GT:50; IV Piggyback:62.5] Out: -   General: Nonverbal.  No distress. Lungs:coarse decreased bases Abd: soft, nondistended Extremities: cr<2 secs Neuro: Not following commands   Lab Results:  Recent Labs    11/17/17 0438 11/18/17 0620  WBC 11.2* 10.1  HGB 11.0* 11.0*  HCT 34.8* 33.8*  PLT 138* 125*   BMET Recent Labs    11/17/17 0438 11/18/17 0620  NA 137 145  K 5.1 4.7  CL 109 117*  CO2 25 24  GLUCOSE 356* 160*  BUN 51* 55*  CREATININE 2.10* 2.19*  CALCIUM 8.8* 8.7*   PT/INR No results for input(s): LABPROT, INR in the last 72 hours. ABG No results for input(s): PHART, HCO3 in the last 72 hours.  Invalid input(s): PCO2, PO2  Studies/Results: Dg Chest Port 1 View  Result Date: 11/18/2017 CLINICAL DATA:  Abnormal respiration EXAM: PORTABLE CHEST 1 VIEW COMPARISON:  11/16/2017 FINDINGS: Normal cardiac silhouette. Feeding tube in gastric antrum. New RIGHT lower lobe airspace disease. Upper lungs clear. IMPRESSION: RIGHT lower lobe pneumonia versus aspiration pneumonitis. These results will be called to the ordering clinician or representative by the Radiologist Assistant, and communication documented in the PACS or zVision Dashboard. Electronically Signed   By: Suzy Bouchard M.D.   On: 11/18/2017 10:16    Anti-infectives: Anti-infectives (From admission, onward)   Start     Dose/Rate Route  Frequency Ordered Stop   11/17/17 1600  piperacillin-tazobactam (ZOSYN) IVPB 3.375 g     3.375 g 12.5 mL/hr over 240 Minutes Intravenous Every 8 hours 11/17/17 1549        Assessment/Plan: Fall SAH/SDH/c spine fx- continue collar, nsurg following, I think prognosis is poor especially given prior functional status and stroke R 9-11th rib fractures - nt suction, really cant do pulm toilet otherwise, tx pna, check abg today PNA- zosyn started yesterday, will add vanc due fact he has been here several days CAD - hold plavix CKD stage III - Cr 2.19, IVF and monitor T2DM - insulin drip Hypothyroidism - IV synthroid dispo- discussed goals of care with wife and family, this has not been addressed yet.  With pna, rib fx and neuro status he may progress to needing intubation.  We will check abg and then I discussed several paths to follow. They are discussing as family Rolm Bookbinder 11/18/2017   Addendum: abg with reasonable oxygenation and normal co2.  Will plan to follow continue nt suctioning.  Family understands may need decision if worsens and they are discussing. Not prepared to give answer right now

## 2017-11-19 LAB — GLUCOSE, CAPILLARY
GLUCOSE-CAPILLARY: 314 mg/dL — AB (ref 70–99)
GLUCOSE-CAPILLARY: 290 mg/dL — AB (ref 70–99)
Glucose-Capillary: 146 mg/dL — ABNORMAL HIGH (ref 70–99)
Glucose-Capillary: 217 mg/dL — ABNORMAL HIGH (ref 70–99)
Glucose-Capillary: 302 mg/dL — ABNORMAL HIGH (ref 70–99)
Glucose-Capillary: 254 mg/dL — ABNORMAL HIGH (ref 70–99)

## 2017-11-19 LAB — BASIC METABOLIC PANEL
ANION GAP: 9 (ref 5–15)
BUN: 70 mg/dL — ABNORMAL HIGH (ref 8–23)
CO2: 23 mmol/L (ref 22–32)
Calcium: 8.1 mg/dL — ABNORMAL LOW (ref 8.9–10.3)
Chloride: 119 mmol/L — ABNORMAL HIGH (ref 98–111)
Creatinine, Ser: 2.37 mg/dL — ABNORMAL HIGH (ref 0.61–1.24)
GFR calc Af Amer: 30 mL/min — ABNORMAL LOW (ref >60)
GFR calc non Af Amer: 26 mL/min — ABNORMAL LOW (ref >60)
Glucose, Bld: 260 mg/dL — ABNORMAL HIGH (ref 70–99)
Potassium: 5.1 mmol/L (ref 3.5–5.1)
Sodium: 151 mmol/L — ABNORMAL HIGH (ref 135–145)

## 2017-11-19 LAB — CBC
HCT: 32.3 % — ABNORMAL LOW (ref 39.0–52.0)
Hemoglobin: 10.3 g/dL — ABNORMAL LOW (ref 13.0–17.0)
MCH: 30.7 pg (ref 26.0–34.0)
MCHC: 31.9 g/dL (ref 30.0–36.0)
MCV: 96.1 fL (ref 80.0–100.0)
NRBC: 0.2 % (ref 0.0–0.2)
PLATELETS: 148 10*3/uL — AB (ref 150–400)
RBC: 3.36 MIL/uL — ABNORMAL LOW (ref 4.22–5.81)
RDW: 14.9 % (ref 11.5–15.5)
WBC: 9.8 10*3/uL (ref 4.0–10.5)

## 2017-11-19 LAB — POCT I-STAT 3, ART BLOOD GAS (G3+)
Acid-base deficit: 4 mmol/L — ABNORMAL HIGH (ref 0.0–2.0)
Bicarbonate: 21 mmol/L (ref 20.0–28.0)
O2 Saturation: 98 %
PCO2 ART: 39.1 mmHg (ref 32.0–48.0)
PH ART: 7.344 — AB (ref 7.350–7.450)
Patient temperature: 101
TCO2: 22 mmol/L (ref 22–32)
pO2, Arterial: 113 mmHg — ABNORMAL HIGH (ref 83.0–108.0)

## 2017-11-19 MED ORDER — INSULIN ASPART 100 UNIT/ML ~~LOC~~ SOLN
0.0000 [IU] | SUBCUTANEOUS | Status: DC
  Administered 2017-11-19: 15 [IU] via SUBCUTANEOUS
  Administered 2017-11-19 (×2): 11 [IU] via SUBCUTANEOUS
  Administered 2017-11-19: 15 [IU] via SUBCUTANEOUS
  Administered 2017-11-20: 4 [IU] via SUBCUTANEOUS

## 2017-11-19 MED ORDER — FREE WATER
200.0000 mL | Freq: Three times a day (TID) | Status: DC
  Administered 2017-11-19 – 2017-11-20 (×4): 200 mL

## 2017-11-19 MED ORDER — INSULIN ASPART 100 UNIT/ML ~~LOC~~ SOLN
3.0000 [IU] | SUBCUTANEOUS | Status: DC
  Administered 2017-11-19 – 2017-11-20 (×4): 3 [IU] via SUBCUTANEOUS

## 2017-11-19 MED ORDER — INSULIN ASPART 100 UNIT/ML ~~LOC~~ SOLN
3.0000 [IU] | SUBCUTANEOUS | Status: DC
  Administered 2017-11-19: 3 [IU] via SUBCUTANEOUS

## 2017-11-19 MED ORDER — FAMOTIDINE 40 MG/5ML PO SUSR
20.0000 mg | ORAL | Status: DC
  Administered 2017-11-19: 20 mg via ORAL
  Filled 2017-11-19: qty 2.5

## 2017-11-19 NOTE — Progress Notes (Signed)
Inpatient Diabetes Program Recommendations  AACE/ADA: New Consensus Statement on Inpatient Glycemic Control (2015)  Target Ranges:  Prepandial:   less than 140 mg/dL      Peak postprandial:   less than 180 mg/dL (1-2 hours)      Critically ill patients:  140 - 180 mg/dL   Results for Todd Hall, Todd Hall (MRN [id number]) as of 11/19/2017 14:38  Ref. Range 11/18/2017 23:32 11/19/2017 03:41 11/19/2017 07:57 11/19/2017 11:58  Glucose-Capillary Latest Ref Range: 70 - 99 mg/dL 146 (H)  1 unit NOVOLOG  217 (H)  3 units NOVOLOG  314 (H)  7 units NOVOLOG  290 (H)  11 units NOVOLOG     Home DM Meds: None listed  Current Orders: Lantus 33 units QHS       Novolog Resistant Correction Scale/ SSI (0-20 units) Q4 hours      CBGs elevated today.  Tube feeds running at 50cc/hour.      MD- Please consider starting Novolog Tube Feed Coverage:  Novolog 3 units Q4 hours  HOLD if tube feeds HELD for any reason     --Will follow patient during hospitalization--  Wyn Quaker RN, MSN, CDE Diabetes Coordinator Inpatient Glycemic Control Team Team Pager: [phone] (8a-5p)

## 2017-11-19 NOTE — Progress Notes (Signed)
Patient ID: Todd Hall, male   DOB: 12/29/1945, 72 y.o.   MRN: [id number] Follow up - Trauma Critical Care  Patient Details:    Todd Hall is an 71 y.o. male.  Lines/tubes : External Urinary Catheter (Active)  Collection Container Standard drainage bag 11/18/2017  8:00 PM  Securement Method Leg strap 11/18/2017  8:00 PM  Intervention Equipment Changed 11/18/2017  8:00 AM  Output (mL) 280 mL 11/19/2017  6:44 AM    Microbiology/Sepsis markers: Results for orders placed or performed during the hospital encounter of 10/23/2017  MRSA PCR Screening     Status: None   Collection Time: 10/21/2017  2:12 PM  Result Value Ref Range Status   MRSA by PCR NEGATIVE NEGATIVE Final    Comment:        The GeneXpert MRSA Assay (FDA approved for NASAL specimens only), is one component of a comprehensive MRSA colonization surveillance program. It is not intended to diagnose MRSA infection nor to guide or monitor treatment for MRSA infections. Performed at [facility], [place] [address] [place], [place] [address]     Anti-infectives:  Anti-infectives (From admission, onward)   Start     Dose/Rate Route Frequency Ordered Stop   11/19/17 1200  vancomycin (VANCOCIN) IVPB 750 mg/150 ml premix     750 mg 150 mL/hr over 60 Minutes Intravenous Every 24 hours 11/18/17 1117     11/18/17 1200  vancomycin (VANCOCIN) 1,500 mg in sodium chloride 0.9 % 500 mL IVPB     1,500 mg 250 mL/hr over 120 Minutes Intravenous  Once 11/18/17 1117 11/18/17 1510   11/17/17 1600  piperacillin-tazobactam (ZOSYN) IVPB 3.375 g     3.375 g 12.5 mL/hr over 240 Minutes Intravenous Every 8 hours 11/17/17 1549        Best Practice/Protocols:  VTE Prophylaxis: Mechanical no sedation  Consults: Treatment Team:  Ashok Pall, MD    Studies:    Events:  Subjective:    Overnight Issues:   Objective:  Vital signs for last 24 hours: Temp:  [100.8 F (38.2 C)-103.2 F (39.6 C)] 102 F (38.9 C) (11/04  0400) Pulse Rate:  [89-121] 110 (11/04 0600) Resp:  [21-37] 30 (11/04 0700) BP: (129-164)/(65-91) 160/84 (11/04 0700) SpO2:  [96 %-100 %] 97 % (11/04 0600) Weight:  [68.9 kg] 68.9 kg (11/04 0648)  Hemodynamic parameters for last 24 hours:    Intake/Output from previous day: 11/03 0701 - 11/04 0700 In: 3244.2 [I.V.:1297.8; NG/GT:1200; IV Piggyback:746.4] Out: 1130 [Urine:1130]  Intake/Output this shift: No intake/output data recorded.  Vent settings for last 24 hours:    Physical Exam:  General: pupils 19mm slug, not responding to pain Neuro: see above HEENT/Neck: collar Resp: some rhonchi B CVS: RRR GI: soft, nontender, BS WNL, no r/g Extremities: no edema  Results for orders placed or performed during the hospital encounter of 11/04/2017 (from the past 24 hour(s))  Glucose, capillary     Status: Abnormal   Collection Time: 11/18/17  9:01 AM  Result Value Ref Range   Glucose-Capillary 151 (H) 70 - 99 mg/dL  Glucose, capillary     Status: Abnormal   Collection Time: 11/18/17  9:54 AM  Result Value Ref Range   Glucose-Capillary 145 (H) 70 - 99 mg/dL  Glucose, capillary     Status: Abnormal   Collection Time: 11/18/17 11:06 AM  Result Value Ref Range   Glucose-Capillary 194 (H) 70 - 99 mg/dL  Glucose, capillary     Status: Abnormal  Collection Time: 11/18/17 12:03 PM  Result Value Ref Range   Glucose-Capillary 166 (H) 70 - 99 mg/dL  Glucose, capillary     Status: Abnormal   Collection Time: 11/18/17  1:08 PM  Result Value Ref Range   Glucose-Capillary 151 (H) 70 - 99 mg/dL  Glucose, capillary     Status: Abnormal   Collection Time: 11/18/17  2:14 PM  Result Value Ref Range   Glucose-Capillary 160 (H) 70 - 99 mg/dL  Glucose, capillary     Status: Abnormal   Collection Time: 11/18/17  3:02 PM  Result Value Ref Range   Glucose-Capillary 145 (H) 70 - 99 mg/dL  Glucose, capillary     Status: Abnormal   Collection Time: 11/18/17  4:07 PM  Result Value Ref Range    Glucose-Capillary 127 (H) 70 - 99 mg/dL  Glucose, capillary     Status: Abnormal   Collection Time: 11/18/17  4:57 PM  Result Value Ref Range   Glucose-Capillary 134 (H) 70 - 99 mg/dL  Glucose, capillary     Status: Abnormal   Collection Time: 11/18/17  5:51 PM  Result Value Ref Range   Glucose-Capillary 148 (H) 70 - 99 mg/dL  Glucose, capillary     Status: Abnormal   Collection Time: 11/18/17  6:58 PM  Result Value Ref Range   Glucose-Capillary 184 (H) 70 - 99 mg/dL  Glucose, capillary     Status: Abnormal   Collection Time: 11/18/17  8:04 PM  Result Value Ref Range   Glucose-Capillary 166 (H) 70 - 99 mg/dL  Glucose, capillary     Status: Abnormal   Collection Time: 11/18/17  9:07 PM  Result Value Ref Range   Glucose-Capillary 181 (H) 70 - 99 mg/dL  Glucose, capillary     Status: Abnormal   Collection Time: 11/18/17 10:48 PM  Result Value Ref Range   Glucose-Capillary 161 (H) 70 - 99 mg/dL  CBC     Status: Abnormal   Collection Time: 11/19/17  3:31 AM  Result Value Ref Range   WBC 9.8 4.0 - 10.5 K/uL   RBC 3.36 (L) 4.22 - 5.81 MIL/uL   Hemoglobin 10.3 (L) 13.0 - 17.0 g/dL   HCT 32.3 (L) 39.0 - 52.0 %   MCV 96.1 80.0 - 100.0 fL   MCH 30.7 26.0 - 34.0 pg   MCHC 31.9 30.0 - 36.0 g/dL   RDW 14.9 11.5 - 15.5 %   Platelets 148 (L) 150 - 400 K/uL   nRBC 0.2 0.0 - 0.2 %  Basic metabolic panel     Status: Abnormal   Collection Time: 11/19/17  3:31 AM  Result Value Ref Range   Sodium 151 (H) 135 - 145 mmol/L   Potassium 5.1 3.5 - 5.1 mmol/L   Chloride 119 (H) 98 - 111 mmol/L   CO2 23 22 - 32 mmol/L   Glucose, Bld 260 (H) 70 - 99 mg/dL   BUN 70 (H) 8 - 23 mg/dL   Creatinine, Ser 2.37 (H) 0.61 - 1.24 mg/dL   Calcium 8.1 (L) 8.9 - 10.3 mg/dL   GFR calc non Af Amer 26 (L) >60 mL/min   GFR calc Af Amer 30 (L) >60 mL/min   Anion gap 9 5 - 15    Assessment & Plan: Present on Admission: . TBI (traumatic brain injury) ([place])    LOS: 4 days   Additional comments:I reviewed  the patient's new clinical lab test results. . Fall SAH/SDH - per Dr. Christella Noa, prognosis  for functional recovery poor R 9-11th rib fractures - nt suction, goals of care discussion below PNA - Vanc/Zosyn empiric, resp CX ordered CAD - hold plavix CKD stage III T2DM - change to resistant scale SSI Hypothyroidism - IV synthroid FEN - hypernatremia - add free water. TF VTE - PAS Dispo  - ICU. I had a detailed discussion with his wife about his current status and goals of care. The family has talked it over as well. Will make DNR/DNI. Continue other support for now. Critical Care Total Time*: 35 Minutes  Georganna Skeans, MD, MPH, [facility] Trauma: [phone] General Surgery: [phone]  11/19/2017  *Care during the described time interval was provided by me. I have reviewed this patient's available data, including medical history, events of note, physical examination and test results as part of my evaluation.

## 2017-11-19 NOTE — Progress Notes (Signed)
Patient ID: Todd Hall, male   DOB: Apr 03, 1945, 72 y.o.   MRN: [id number] BP 120/61   Pulse (!) 104   Temp (!) 102.5 F (39.2 C) (Axillary)   Resp (!) 29   Ht 5\' 6"  (1.676 m)   Wt 68.9 kg   SpO2 96%   BMI 24.52 kg/m  Comatose, breathing on his own He is now DNR, I do not expect him to survive.  Mrs. Allman for now simply wants to make sure that he is comfortable.

## 2017-11-20 ENCOUNTER — Inpatient Hospital Stay (HOSPITAL_COMMUNITY): Payer: Medicare Other

## 2017-11-20 DIAGNOSIS — I629 Nontraumatic intracranial hemorrhage, unspecified: Secondary | ICD-10-CM

## 2017-11-20 DIAGNOSIS — S069X6A Unspecified intracranial injury with loss of consciousness greater than 24 hours without return to pre-existing conscious level with patient surviving, initial encounter: Secondary | ICD-10-CM

## 2017-11-20 DIAGNOSIS — Z515 Encounter for palliative care: Secondary | ICD-10-CM

## 2017-11-20 LAB — BASIC METABOLIC PANEL
ANION GAP: 8 (ref 5–15)
BUN: 103 mg/dL — ABNORMAL HIGH (ref 8–23)
CHLORIDE: 127 mmol/L — AB (ref 98–111)
CO2: 20 mmol/L — AB (ref 22–32)
Calcium: 7.8 mg/dL — ABNORMAL LOW (ref 8.9–10.3)
Creatinine, Ser: 2.84 mg/dL — ABNORMAL HIGH (ref 0.61–1.24)
GFR calc Af Amer: 24 mL/min — ABNORMAL LOW (ref >60)
GFR calc non Af Amer: 21 mL/min — ABNORMAL LOW (ref >60)
Glucose, Bld: 165 mg/dL — ABNORMAL HIGH (ref 70–99)
Potassium: 4.9 mmol/L (ref 3.5–5.1)
Sodium: 155 mmol/L — ABNORMAL HIGH (ref 135–145)

## 2017-11-20 LAB — GLUCOSE, CAPILLARY
Glucose-Capillary: 305 mg/dL — ABNORMAL HIGH (ref 70–99)
Glucose-Capillary: 167 mg/dL — ABNORMAL HIGH (ref 70–99)
Glucose-Capillary: 118 mg/dL — ABNORMAL HIGH (ref 70–99)

## 2017-11-20 LAB — CBC
HEMATOCRIT: 33.3 % — AB (ref 39.0–52.0)
HEMOGLOBIN: 10.1 g/dL — AB (ref 13.0–17.0)
MCH: 30.2 pg (ref 26.0–34.0)
MCHC: 30.3 g/dL (ref 30.0–36.0)
MCV: 99.7 fL (ref 80.0–100.0)
NRBC: 0.2 % (ref 0.0–0.2)
Platelets: 136 10*3/uL — ABNORMAL LOW (ref 150–400)
RBC: 3.34 MIL/uL — AB (ref 4.22–5.81)
RDW: 15.8 % — ABNORMAL HIGH (ref 11.5–15.5)
WBC: 16.1 10*3/uL — ABNORMAL HIGH (ref 4.0–10.5)

## 2017-11-20 MED ORDER — SODIUM CHLORIDE 0.9 % IV SOLN: 250.0000 mL | INTRAVENOUS | Status: DC | PRN

## 2017-11-20 MED ORDER — GLYCOPYRROLATE 1 MG PO TABS
1.0000 mg | ORAL_TABLET | ORAL | Status: DC | PRN
  Filled 2017-11-20: qty 1

## 2017-11-20 MED ORDER — GLYCOPYRROLATE 0.2 MG/ML IJ SOLN
0.4000 mg | Freq: Three times a day (TID) | INTRAMUSCULAR | Status: DC
  Administered 2017-11-20 (×2): 0.4 mg via INTRAVENOUS
  Filled 2017-11-20 (×3): qty 2

## 2017-11-20 MED ORDER — HYDROMORPHONE HCL 1 MG/ML IJ SOLN: 0.5000 mg | Freq: Two times a day (BID) | INTRAMUSCULAR | Status: DC | PRN

## 2017-11-20 MED ORDER — SODIUM CHLORIDE 0.9% FLUSH: 3.0000 mL | INTRAVENOUS | Status: DC | PRN

## 2017-11-20 MED ORDER — BIOTENE DRY MOUTH MT LIQD: 15.0000 mL | OROMUCOSAL | Status: DC | PRN

## 2017-11-20 MED ORDER — LORAZEPAM 2 MG/ML PO CONC: 1.0000 mg | ORAL | Status: DC | PRN

## 2017-11-20 MED ORDER — GLYCOPYRROLATE 0.2 MG/ML IJ SOLN: 0.2000 mg | INTRAMUSCULAR | Status: DC | PRN

## 2017-11-20 MED ORDER — LORAZEPAM 1 MG PO TABS: 1.0000 mg | ORAL_TABLET | ORAL | Status: DC | PRN

## 2017-11-20 MED ORDER — POLYVINYL ALCOHOL 1.4 % OP SOLN
1.0000 [drp] | Freq: Four times a day (QID) | OPHTHALMIC | Status: DC | PRN
  Filled 2017-11-20: qty 15

## 2017-11-20 MED ORDER — LORAZEPAM 2 MG/ML IJ SOLN: 1.0000 mg | INTRAMUSCULAR | Status: DC | PRN

## 2017-11-20 MED ORDER — SODIUM CHLORIDE 0.9% FLUSH: 3.0000 mL | Freq: Two times a day (BID) | INTRAVENOUS | Status: DC

## 2017-11-20 MED ORDER — HALOPERIDOL 0.5 MG PO TABS
0.5000 mg | ORAL_TABLET | ORAL | Status: DC | PRN
  Filled 2017-11-20: qty 1

## 2017-11-20 MED ORDER — GLYCOPYRROLATE 0.2 MG/ML IJ SOLN
0.2000 mg | INTRAMUSCULAR | Status: DC | PRN
  Administered 2017-11-20: 0.2 mg via INTRAVENOUS

## 2017-11-20 MED ORDER — HALOPERIDOL LACTATE 5 MG/ML IJ SOLN: 0.5000 mg | INTRAMUSCULAR | Status: DC | PRN

## 2017-11-20 MED ORDER — HALOPERIDOL LACTATE 2 MG/ML PO CONC
0.5000 mg | ORAL | Status: DC | PRN
  Filled 2017-11-20: qty 0.3

## 2017-11-21 ENCOUNTER — Encounter: Payer: Self-pay | Admitting: Cardiothoracic Surgery

## 2017-12-16 NOTE — Progress Notes (Signed)
Nutrition Brief Note  Chart reviewed. Pt now transitioning to comfort care.  No further nutrition interventions warranted at this time.  Please re-consult as needed.   Kiona Blume RD, LDN, CNSC [phone] Pager [phone] After Hours Pager    

## 2017-12-16 NOTE — Progress Notes (Signed)
   2017/12/02 1100  Clinical Encounter Type  Visited With Patient;Patient and family together  Visit Type Follow-up  Referral From Nurse  Consult/Referral To Chaplain  Spiritual Encounters  Spiritual Needs Prayer;Emotional;Grief support;Sacred text  Stress Factors  Patient Stress Factors None identified  Family Stress Factors Exhausted;Loss of control;Major life changes   Chaplain followed up on PT and Family. Family was present and at bedside. I offered spiritual care with ministry of presence, words of encouragement, sacred text, and prayer. Chaplain available upon request.  Chaplain Fidel Levy [phone]

## 2017-12-16 NOTE — Progress Notes (Signed)
ME Sabra Heck called and confirmed that patient is a [employer] case and to send the body to the morgue and [place] will examine body tomorrow.  Family left [facility] and [facility] was made aware with Referral no. [phone].

## 2017-12-16 NOTE — Progress Notes (Signed)
Patient ID: Todd Hall, male   DOB: 07/17/1945, 72 y.o.   MRN: [id number] Follow up - Trauma Critical Care  Patient Details:    Todd Hall is an 72 y.o. male.  Lines/tubes : External Urinary Catheter (Active)  Collection Container Standard drainage bag 11/19/2017  8:00 PM  Securement Method Leg strap 11/19/2017  8:00 PM  Intervention Equipment Changed 11/19/2017  8:00 PM  Output (mL) 0 mL 11/19/2017  9:00 PM    Microbiology/Sepsis markers: Results for orders placed or performed during the hospital encounter of 10/28/2017  MRSA PCR Screening     Status: None   Collection Time: 11/05/2017  2:12 PM  Result Value Ref Range Status   MRSA by PCR NEGATIVE NEGATIVE Final    Comment:        The GeneXpert MRSA Assay (FDA approved for NASAL specimens only), is one component of a comprehensive MRSA colonization surveillance program. It is not intended to diagnose MRSA infection nor to guide or monitor treatment for MRSA infections. Performed at [facility], [place] [address], [place], [place] [address]     Anti-infectives:  Anti-infectives (From admission, onward)   Start     Dose/Rate Route Frequency Ordered Stop   11/19/17 1200  vancomycin (VANCOCIN) IVPB 750 mg/150 ml premix     750 mg 150 mL/hr over 60 Minutes Intravenous Every 24 hours 11/18/17 1117     11/18/17 1200  vancomycin (VANCOCIN) 1,500 mg in sodium chloride 0.9 % 500 mL IVPB     1,500 mg 250 mL/hr over 120 Minutes Intravenous  Once 11/18/17 1117 11/18/17 1510   11/17/17 1600  piperacillin-tazobactam (ZOSYN) IVPB 3.375 g     3.375 g 12.5 mL/hr over 240 Minutes Intravenous Every 8 hours 11/17/17 1549        Best Practice/Protocols:  VTE Prophylaxis: Mechanical .  Consults: Treatment Team:  Ashok Pall, MD    Studies:    Events:  Subjective:    Overnight Issues:   Objective:  Vital signs for last 24 hours: Temp:  [98.9 F (37.2 C)-103.7 F (39.8 C)] 99.1 F (37.3 C) (11/05 0600) Pulse Rate:   [72-111] 86 (11/05 0700) Resp:  [18-31] 20 (11/05 0700) BP: (92-141)/(51-69) 99/59 (11/05 0700) SpO2:  [94 %-100 %] 100 % (11/05 0700) Weight:  [74.9 kg] 74.9 kg (11/05 0500)  Hemodynamic parameters for last 24 hours:    Intake/Output from previous day: 11/04 0701 - 11/05 0700 In: 3134.4 [I.V.:1257.8; NG/GT:1520; IV Piggyback:356.6] Out: 1500 [Urine:1500]  Intake/Output this shift: No intake/output data recorded.  Vent settings for last 24 hours:    Physical Exam:  General: unresp Neuro: coma, shrug to noxious HEENT/Neck: collar Resp: rhonchi B CVS: RRR 90s GI: soft, NT Extremities: edema 1+  Results for orders placed or performed during the hospital encounter of 11/08/2017 (from the past 24 hour(s))  Glucose, capillary     Status: Abnormal   Collection Time: 11/19/17 11:58 AM  Result Value Ref Range   Glucose-Capillary 290 (H) 70 - 99 mg/dL  Glucose, capillary     Status: Abnormal   Collection Time: 11/19/17  4:22 PM  Result Value Ref Range   Glucose-Capillary 302 (H) 70 - 99 mg/dL  Glucose, capillary     Status: Abnormal   Collection Time: 11/19/17 11:26 PM  Result Value Ref Range   Glucose-Capillary 254 (H) 70 - 99 mg/dL  Glucose, capillary     Status: Abnormal   Collection Time: 2017/12/01  3:27 AM  Result Value Ref  Range   Glucose-Capillary 167 (H) 70 - 99 mg/dL  CBC     Status: Abnormal   Collection Time: November 26, 2017  5:18 AM  Result Value Ref Range   WBC 16.1 (H) 4.0 - 10.5 K/uL   RBC 3.34 (L) 4.22 - 5.81 MIL/uL   Hemoglobin 10.1 (L) 13.0 - 17.0 g/dL   HCT 33.3 (L) 39.0 - 52.0 %   MCV 99.7 80.0 - 100.0 fL   MCH 30.2 26.0 - 34.0 pg   MCHC 30.3 30.0 - 36.0 g/dL   RDW 15.8 (H) 11.5 - 15.5 %   Platelets 136 (L) 150 - 400 K/uL   nRBC 0.2 0.0 - 0.2 %  Basic metabolic panel     Status: Abnormal   Collection Time: 11-26-17  5:18 AM  Result Value Ref Range   Sodium 155 (H) 135 - 145 mmol/L   Potassium 4.9 3.5 - 5.1 mmol/L   Chloride 127 (H) 98 - 111 mmol/L   CO2  20 (L) 22 - 32 mmol/L   Glucose, Bld 165 (H) 70 - 99 mg/dL   BUN 103 (H) 8 - 23 mg/dL   Creatinine, Ser 2.84 (H) 0.61 - 1.24 mg/dL   Calcium 7.8 (L) 8.9 - 10.3 mg/dL   GFR calc non Af Amer 21 (L) >60 mL/min   GFR calc Af Amer 24 (L) >60 mL/min   Anion gap 8 5 - 15    Assessment & Plan: Present on Admission: . TBI (traumatic brain injury) ([place])    LOS: 5 days   Additional comments:I reviewed the patient's new clinical lab test results. . Fall SAH/SDH - per Dr. Christella Noa, prognosis for functional recovery poor R 9-11th rib fractures - nt suction, goals of care discussion below PNA - Vanc/Zosyn empiric, resp CX  CAD - hold plavix CKD stage III T2DM - scheduled insulin added yesterday Hypothyroidism - IV synthroid FEN - D/C NGT per below VTE - PAS Dispo  - I met with his wife and the Palliative care team. Will transition to comfort care. D/C NGT and collar. D/C fluids. Will D/C O2 once family all aware. To 6N later today. Critical Care Total Time*: 44 Minutes including goals of care discussion  Georganna Skeans, MD, MPH, FACS Trauma: [phone] General Surgery: [phone]  November 26, 2017  *Care during the described time interval was provided by me. I have reviewed this patient's available data, including medical history, events of note, physical examination and test results as part of my evaluation.

## 2017-12-16 NOTE — Death Summary Note (Signed)
DEATH SUMMARY   Patient Details  Name: Todd Hall MRN: [id number] DOB: 12-16-1945  Admission/Discharge Information   Admit Date:  11-24-2017  Date of Death: Date of Death: 11-29-2017  Time of Death: Time of Death: 1859  Length of Stay: 5  Referring Physician: Jani Gravel, MD   Reason(s) for Hospitalization  TBI  Diagnoses  Preliminary cause of death: Traumatic brain injury Secondary Diagnoses (including complications and co-morbidities):  Active Problems:   TBI (traumatic brain injury) ([place])   Intracranial hemorrhage ([place])   Palliative care encounter   Comfort measures only status   Terminal care   Brief Hospital Course (including significant findings, care, treatment, and services provided and events leading to death)   Patient is a 72 year old male with PMH significant for CAD, Hx of CVA, diabetes, CKD stage III who presented today to [facility] as a level 2 trauma after falling down some stairs this AM. Patient has had several recent falls and was seen at [facility] about 1.5 months ago after a fall where an old cervical spine fracture was found. Patient was placed in a cervical collar and referred to a neurosurgeon in [facility] who he was scheduled to see tomorrow. Patient lives at home with his wife and reportedly tried to get up by himself with a cane prior to falling.  Work-up in the emergency department revealed traumatic brain injury with subarachnoid hemorrhage and subdural hematoma as well as right rib fractures 9 through 11.  He was admitted to the trauma service and was monitored in the intensive care unit.  Follow-up CT scan of the head showed expected evolution of his traumatic brain injury.  Unfortunately he remained unresponsive.  He developed worsening respiratory failure and pneumonia.  This was treated empirically.  He did not improve neurologically and goals of care discussions were had with the family.  Decision was made to not move forward with intubation.  The  palliative care service was involved and he transition to comfort care as he developed further respiratory complications due to his traumatic brain injury.  He was kept comfortable and passed away.   Pertinent Labs and Studies  Significant Diagnostic Studies Ct Abdomen Pelvis Wo Contrast  Result Date: 11/24/17 CLINICAL DATA:  [place] down stairs. EXAM: CT CHEST, ABDOMEN AND PELVIS WITHOUT CONTRAST TECHNIQUE: Multidetector CT imaging of the chest, abdomen and pelvis was performed following the standard protocol without IV contrast. COMPARISON:  PET-CT on 04/25/2006 FINDINGS: CT CHEST FINDINGS Cardiovascular: There is extensive atherosclerotic calcification of the coronary arteries. Heart size is normal. No pericardial effusion. There is atherosclerotic calcification of the thoracic aorta not associated with aneurysm or evidence for dissection. The noncontrast appearance of the pulmonary arteries is normal. Mediastinum/Nodes: The visualized portion of the thyroid gland has a normal appearance. Esophagus is normal in appearance. No mediastinal, hilar, or axillary adenopathy. Lungs/Pleura: There are dependent changes in the posterior aspects of the lungs bilaterally. The appearance favors atelectasis over contusion. Musculoskeletal: There are acute fractures of the RIGHT 7th, 8th, and 9th ribs. There are acute fractures of the LEFT 10th and 11th ribs. No vertebral fracture. CT ABDOMEN PELVIS FINDINGS Hepatobiliary: The liver is homogeneous without focal lesion or evidence for laceration. Gallbladder is present. Pancreas: Unremarkable. No pancreatic ductal dilatation or surrounding inflammatory changes. Spleen: Normal in size without focal abnormality. Adrenals/Urinary Tract: A 9 millimeter LEFT adrenal adenoma is present. The RIGHT adrenal gland is normal in appearance. The noncontrast appearance of both kidneys is normal. There is  no hydronephrosis. The ureters are unremarkable. The bladder and visualized  portion of the urethra are normal. Stomach/Bowel: The stomach and small bowel loops are normal in appearance. The loops of colon are normal in appearance. Moderate stool burden in the rectosigmoid colon. The appendix is well seen and has a normal appearance. Vascular/Lymphatic: There is atherosclerotic calcification of the abdominal aorta not associated with aneurysm. No retroperitoneal or mesenteric adenopathy. Reproductive: Prostate is unremarkable. Other: No abdominal wall hernia or abnormality. No abdominopelvic ascites. Musculoskeletal: There are degenerative changes in the RIGHT hip and in the mid lumbar spine. Bilateral pars defects at L3 are associated with grade 1 anterolisthesis of L3 on L4. See report for the spine, performed on the same day. IMPRESSION: 1. Bilateral rib fractures, ribs 7, 8, 9 on the RIGHT and ribs 10 and 11 on the LEFT cough. 2. Bibasilar atelectasis. No significant contusions or pneumothorax. 3. No evidence for injury of the abdomen/pelvis. 4. Benign LEFT adrenal adenoma. 5. Moderate stool burden. 6.  Aortic atherosclerosis.  (ICD10-I70.0) 7. Pars defects and grade 1 anterolisthesis of L3 on L4. No acute fracture of the spine or pelvis. Electronically Signed   By: Nolon Nations M.D.   On: 11/11/2017 13:56   Ct Head Wo Contrast  Result Date: 11/16/2017 CLINICAL DATA:  Follow-up examination for traumatic brain injury. EXAM: CT HEAD WITHOUT CONTRAST TECHNIQUE: Contiguous axial images were obtained from the base of the skull through the vertex without intravenous contrast. COMPARISON:  Prior CT from 10/27/2017. FINDINGS: Brain: Hemorrhagic contusion centered at the left basal ganglia is a vaulting, perhaps slightly increased in size measuring 2.5 x 2.2 cm. Slightly increased localized vasogenic edema without significant regional mass effect. Additional scattered smaller small volume hemorrhages along the subependymal region of the right lateral ventricle relatively similar.  Additional parenchymal hemorrhage at the posterior right parietal region relatively similar measuring 17 x 11 mm (series 3, image 27). Scattered moderate volume subarachnoid hemorrhage involving the bilateral cerebral hemispheres is increased, most notable at the anterior left operculum region as well as the right sylvian fissure (series 3, image 18). Scattered foci of intraventricular hemorrhage seen with blood layering within the occipital horns of both lateral ventricles. Multifocal hemorrhages seen along the falx. Overall, appearance is increased from previous. Scattered small volume hemorrhage seen along the anterior falx. There has been interval development of a 5 mm subdural hygroma overlying the anterior left frontal convexity, likely reactive. No significant midline shift. No hydrocephalus or ventricular trapping. Basilar cisterns remain patent. Underlying atrophy with chronic small vessel ischemic disease noted. No acute large vessel territory infarct. Scatter remote lacunar infarcts seen involving the bilateral thalami and right basal ganglia. Vascular: No hyperdense vessel. Scattered vascular calcifications noted within the carotid siphons. Skull: Evolving soft tissue contusions at the left and central posterior scalp. Calvarium intact. Sinuses/Orbits: Globes and orbital soft tissues demonstrate no acute finding. Sequelae of chronic sinusitis noted involving the left greater than right maxillary sinuses. Mastoids are clear. Other: None. IMPRESSION: 1. Traumatic brain injury with, similar to slightly increased in size from previous. Localized vasogenic edema about a few these hemorrhages without significant regional mass effect. 2. Scattered moderate volume subarachnoid hemorrhage involving the bilateral cerebral hemispheres, increased from previous. 3. Scattered intraventricular hemorrhage, also increased from previous. No hydrocephalus or ventricular trapping. 4. Interval development of a 5 mm subdural  hygroma overlying the anterior left frontal convexity, likely reactive. No significant midline shift or mass effect. 5. Evolving left posterior and central scalp contusions. Electronically  Signed   By: Jeannine Boga M.D.   On: 11/16/2017 02:41   Ct Head Wo Contrast  Result Date: 11/06/2017 CLINICAL DATA:  Mechanical fall down 13 stairs. Loss of consciousness. EXAM: CT HEAD WITHOUT CONTRAST CT CERVICAL SPINE WITHOUT CONTRAST TECHNIQUE: Multidetector CT imaging of the head and cervical spine was performed following the standard protocol without intravenous contrast. Multiplanar CT image reconstructions of the cervical spine were also generated. COMPARISON:  11/01/2017. FINDINGS: CT HEAD FINDINGS Brain: There are multiple areas of intracranial hemorrhage. There is a parenchymal hemorrhage centered on the left ventricular nuclei, measuring 26 x 18 x 18 mm. There is adjacent less well-defined parenchymal hemorrhage anterior to the base a ganglia and along the anterior left insular cortex. There is a parenchymal hemorrhage in the subcortical white matter of the superior, posterior right frontal lobe, measuring 19 x 12 x 17 mm, with a tiny adjacent focus of subcortical white matter hemorrhage. Parenchymal hemorrhage is seen in the inferior right frontal lobe anterior to the sylvian fissure. There are also small areas of subarachnoid hemorrhage, along the right sylvian fissure, over the anterior left frontal lobe, in the anterior medial frontal lobes adjacent to the interhemispheric fissure and in the superior right parietal lobe. Subdural hemorrhage is noted along the anterior interhemispheric fissure. There is intraventricular hemorrhage, between the septum flu syndrome and in the right lateral ventricle. There is no significant mass effect and no midline shift. No evidence of an ischemic infarct. The ventricles are normal in overall configuration. There is no hydrocephalus. No extra-axial masses. Vascular: No  hyperdense vessel or unexpected calcification. Skull: No skull fracture or lesion. Sinuses/Orbits: Globes and orbits are unremarkable. Mild ethmoid and left maxillary sinus mucosal thickening. Small amount of dependent fluid in the left maxillary sinus. Other: None. CT CERVICAL SPINE FINDINGS Alignment: Slight degenerative anterolisthesis of C3-C4, stable. No other spondylolisthesis. Skull base and vertebrae: No acute fracture. Nondisplaced fracture of left C7 transverse process is stable from the prior study. No primary bone lesion or focal pathologic process. Soft tissues and spinal canal: No prevertebral fluid or swelling. No visible canal hematoma. Disc levels: Status post anterior cervical disc fusion at C6-C7. Anterior fusion plate and fixation screws well-seated. There is mature bone fusing the disc interspace. The appearance is stable. There is spondylotic disc bulging and endplate spurring from H2-D9 through C5-C6. No evidence of an acute disc herniation. Facet degenerative changes are noted most severe on the right at C3-C4. These findings are stable. Upper chest: No acute findings. No mass or adenopathy. Clear lung apices. Other: None. IMPRESSION: HEAD CT 1. There is acute intracranial hemorrhage with areas of parenchymal hemorrhage, subarachnoid and subdural hemorrhage and intraventricular hemorrhage as detailed above. No hydrocephalus. No midline shift. 2. No skull fracture. Critical Value/emergent results were called by telephone at the time of interpretation on 11/08/2017 at 12:09 pm to Dr. Maree Erie [facility] , who verbally acknowledged these results. CERVICAL CT 1. No acute fracture or acute finding. C7 left transverse process fracture noted on the prior study is stable. Electronically Signed   By: Lajean Manes M.D.   On: 10/28/2017 12:13   Ct Chest Wo Contrast  Result Date: 10/17/2017 CLINICAL DATA:  [place] down stairs. EXAM: CT CHEST, ABDOMEN AND PELVIS WITHOUT CONTRAST TECHNIQUE: Multidetector CT  imaging of the chest, abdomen and pelvis was performed following the standard protocol without IV contrast. COMPARISON:  PET-CT on 04/25/2006 FINDINGS: CT CHEST FINDINGS Cardiovascular: There is extensive atherosclerotic calcification of the coronary arteries. Heart  size is normal. No pericardial effusion. There is atherosclerotic calcification of the thoracic aorta not associated with aneurysm or evidence for dissection. The noncontrast appearance of the pulmonary arteries is normal. Mediastinum/Nodes: The visualized portion of the thyroid gland has a normal appearance. Esophagus is normal in appearance. No mediastinal, hilar, or axillary adenopathy. Lungs/Pleura: There are dependent changes in the posterior aspects of the lungs bilaterally. The appearance favors atelectasis over contusion. Musculoskeletal: There are acute fractures of the RIGHT 7th, 8th, and 9th ribs. There are acute fractures of the LEFT 10th and 11th ribs. No vertebral fracture. CT ABDOMEN PELVIS FINDINGS Hepatobiliary: The liver is homogeneous without focal lesion or evidence for laceration. Gallbladder is present. Pancreas: Unremarkable. No pancreatic ductal dilatation or surrounding inflammatory changes. Spleen: Normal in size without focal abnormality. Adrenals/Urinary Tract: A 9 millimeter LEFT adrenal adenoma is present. The RIGHT adrenal gland is normal in appearance. The noncontrast appearance of both kidneys is normal. There is no hydronephrosis. The ureters are unremarkable. The bladder and visualized portion of the urethra are normal. Stomach/Bowel: The stomach and small bowel loops are normal in appearance. The loops of colon are normal in appearance. Moderate stool burden in the rectosigmoid colon. The appendix is well seen and has a normal appearance. Vascular/Lymphatic: There is atherosclerotic calcification of the abdominal aorta not associated with aneurysm. No retroperitoneal or mesenteric adenopathy. Reproductive: Prostate is  unremarkable. Other: No abdominal wall hernia or abnormality. No abdominopelvic ascites. Musculoskeletal: There are degenerative changes in the RIGHT hip and in the mid lumbar spine. Bilateral pars defects at L3 are associated with grade 1 anterolisthesis of L3 on L4. See report for the spine, performed on the same day. IMPRESSION: 1. Bilateral rib fractures, ribs 7, 8, 9 on the RIGHT and ribs 10 and 11 on the LEFT cough. 2. Bibasilar atelectasis. No significant contusions or pneumothorax. 3. No evidence for injury of the abdomen/pelvis. 4. Benign LEFT adrenal adenoma. 5. Moderate stool burden. 6.  Aortic atherosclerosis.  (ICD10-I70.0) 7. Pars defects and grade 1 anterolisthesis of L3 on L4. No acute fracture of the spine or pelvis. Electronically Signed   By: Nolon Nations M.D.   On: 11/05/2017 13:56   Ct Cervical Spine Wo Contrast  Result Date: 10/28/2017 CLINICAL DATA:  Mechanical fall down 13 stairs. Loss of consciousness. EXAM: CT HEAD WITHOUT CONTRAST CT CERVICAL SPINE WITHOUT CONTRAST TECHNIQUE: Multidetector CT imaging of the head and cervical spine was performed following the standard protocol without intravenous contrast. Multiplanar CT image reconstructions of the cervical spine were also generated. COMPARISON:  11/01/2017. FINDINGS: CT HEAD FINDINGS Brain: There are multiple areas of intracranial hemorrhage. There is a parenchymal hemorrhage centered on the left ventricular nuclei, measuring 26 x 18 x 18 mm. There is adjacent less well-defined parenchymal hemorrhage anterior to the base a ganglia and along the anterior left insular cortex. There is a parenchymal hemorrhage in the subcortical white matter of the superior, posterior right frontal lobe, measuring 19 x 12 x 17 mm, with a tiny adjacent focus of subcortical white matter hemorrhage. Parenchymal hemorrhage is seen in the inferior right frontal lobe anterior to the sylvian fissure. There are also small areas of subarachnoid hemorrhage,  along the right sylvian fissure, over the anterior left frontal lobe, in the anterior medial frontal lobes adjacent to the interhemispheric fissure and in the superior right parietal lobe. Subdural hemorrhage is noted along the anterior interhemispheric fissure. There is intraventricular hemorrhage, between the septum flu syndrome and in the right lateral ventricle.  There is no significant mass effect and no midline shift. No evidence of an ischemic infarct. The ventricles are normal in overall configuration. There is no hydrocephalus. No extra-axial masses. Vascular: No hyperdense vessel or unexpected calcification. Skull: No skull fracture or lesion. Sinuses/Orbits: Globes and orbits are unremarkable. Mild ethmoid and left maxillary sinus mucosal thickening. Small amount of dependent fluid in the left maxillary sinus. Other: None. CT CERVICAL SPINE FINDINGS Alignment: Slight degenerative anterolisthesis of C3-C4, stable. No other spondylolisthesis. Skull base and vertebrae: No acute fracture. Nondisplaced fracture of left C7 transverse process is stable from the prior study. No primary bone lesion or focal pathologic process. Soft tissues and spinal canal: No prevertebral fluid or swelling. No visible canal hematoma. Disc levels: Status post anterior cervical disc fusion at C6-C7. Anterior fusion plate and fixation screws well-seated. There is mature bone fusing the disc interspace. The appearance is stable. There is spondylotic disc bulging and endplate spurring from O1-Y0 through C5-C6. No evidence of an acute disc herniation. Facet degenerative changes are noted most severe on the right at C3-C4. These findings are stable. Upper chest: No acute findings. No mass or adenopathy. Clear lung apices. Other: None. IMPRESSION: HEAD CT 1. There is acute intracranial hemorrhage with areas of parenchymal hemorrhage, subarachnoid and subdural hemorrhage and intraventricular hemorrhage as detailed above. No hydrocephalus.  No midline shift. 2. No skull fracture. Critical Value/emergent results were called by telephone at the time of interpretation on 11/14/2017 at 12:09 pm to Dr. Maree Erie [facility] , who verbally acknowledged these results. CERVICAL CT 1. No acute fracture or acute finding. C7 left transverse process fracture noted on the prior study is stable. Electronically Signed   By: Lajean Manes M.D.   On: 11/05/2017 12:13   Dg Pelvis Portable  Result Date: 11/13/2017 CLINICAL DATA:  72 year old male status post fall down steps. Level 2 trauma. EXAM: PORTABLE PELVIS 1-2 VIEWS COMPARISON:  None available. FINDINGS: Bone mineralization is within normal limits for age. Femoral heads are normally located. Proximal femurs appear grossly intact. No pelvis fracture identified. SI joints appear normal. Lower lumbar disc and endplate degeneration. Negative visible abdominal and pelvic visceral contours aside from iliofemoral calcified atherosclerosis. IMPRESSION: No acute fracture or dislocation identified about the pelvis. Electronically Signed   By: Genevie Ann M.D.   On: 10/21/2017 11:50   Ct T-spine No Charge  Result Date: 10/19/2017 CLINICAL DATA:  Back pain.  Fell, with cervical spine trauma. EXAM: CT THORACIC AND LUMBAR SPINE WITHOUT CONTRAST TECHNIQUE: Multidetector CT imaging of the thoracic and lumbar spine was performed without contrast. Multiplanar CT image reconstructions were also generated. COMPARISON:  None. FINDINGS: CT THORACIC SPINE FINDINGS Alignment: Anatomic/physiologic. Vertebrae: No thoracic spine fracture is evident. Paraspinal and other soft tissues: Unremarkable. No pneumothorax or visible mass. See separate CT chest report. Disc levels: Shallow calcific protrusions at T6-7 and T9-10 do not appear compressive. CT LUMBAR SPINE FINDINGS Segmentation: Standard. Alignment: 4 mm anterolisthesis L3-4.  Trace retrolisthesis L5-S1. Vertebrae: Sclerotic change above and below L3-4 related to chronic disc space  narrowing. BILATERAL L3 pars defects. Paraspinal and other soft tissues: Reported under CT abdomen and pelvis separately. Disc levels: L1-L2:  Normal. L2-L3:  Calcified annulus.  Facet arthropathy.  No fracture. L3-L4: 4 mm anterolisthesis. Severe disc space narrowing, near complete collapse. Osseous spurring. No compression fracture. BILATERAL L3 pars defects. Facet arthropathy is superimposed. Moderate stenosis is likely just below the L3-4 interspace. BILATERAL L3 and L4 neural impingement are possible. L4-L5: Unremarkable disc space.  Facet arthropathy. No subarticular zone narrowing. BILATERAL foraminal narrowing slightly worse on the LEFT. L5-S1: Osseous spurring. Trace retrolisthesis. Facet arthropathy. Subarticular zone and foraminal zone narrowing could affect the L5 and S1 nerve roots. IMPRESSION: CT THORACIC SPINE IMPRESSION No thoracic vertebral body fracture or traumatic subluxation. Minor disc pathology, unlikely to be compressive. CT LUMBAR SPINE IMPRESSION 4 mm of slip at L3-4 is chronic, related to BILATERAL L3 pars defects. Moderate stenosis is likely at this level. Disc pathology at L4-5 and L5-S1 is suboptimally evaluated on noncontrast lumbar CT, but could potentially contribute to subarticular zone or foraminal zone narrowing. No posttraumatic sequelae such as fracture or traumatic subluxation, are evident. Electronically Signed   By: Staci Righter M.D.   On: 11/11/2017 13:51   Ct L-spine No Charge  Result Date: 10/27/2017 CLINICAL DATA:  Back pain.  Fell, with cervical spine trauma. EXAM: CT THORACIC AND LUMBAR SPINE WITHOUT CONTRAST TECHNIQUE: Multidetector CT imaging of the thoracic and lumbar spine was performed without contrast. Multiplanar CT image reconstructions were also generated. COMPARISON:  None. FINDINGS: CT THORACIC SPINE FINDINGS Alignment: Anatomic/physiologic. Vertebrae: No thoracic spine fracture is evident. Paraspinal and other soft tissues: Unremarkable. No pneumothorax  or visible mass. See separate CT chest report. Disc levels: Shallow calcific protrusions at T6-7 and T9-10 do not appear compressive. CT LUMBAR SPINE FINDINGS Segmentation: Standard. Alignment: 4 mm anterolisthesis L3-4.  Trace retrolisthesis L5-S1. Vertebrae: Sclerotic change above and below L3-4 related to chronic disc space narrowing. BILATERAL L3 pars defects. Paraspinal and other soft tissues: Reported under CT abdomen and pelvis separately. Disc levels: L1-L2:  Normal. L2-L3:  Calcified annulus.  Facet arthropathy.  No fracture. L3-L4: 4 mm anterolisthesis. Severe disc space narrowing, near complete collapse. Osseous spurring. No compression fracture. BILATERAL L3 pars defects. Facet arthropathy is superimposed. Moderate stenosis is likely just below the L3-4 interspace. BILATERAL L3 and L4 neural impingement are possible. L4-L5: Unremarkable disc space. Facet arthropathy. No subarticular zone narrowing. BILATERAL foraminal narrowing slightly worse on the LEFT. L5-S1: Osseous spurring. Trace retrolisthesis. Facet arthropathy. Subarticular zone and foraminal zone narrowing could affect the L5 and S1 nerve roots. IMPRESSION: CT THORACIC SPINE IMPRESSION No thoracic vertebral body fracture or traumatic subluxation. Minor disc pathology, unlikely to be compressive. CT LUMBAR SPINE IMPRESSION 4 mm of slip at L3-4 is chronic, related to BILATERAL L3 pars defects. Moderate stenosis is likely at this level. Disc pathology at L4-5 and L5-S1 is suboptimally evaluated on noncontrast lumbar CT, but could potentially contribute to subarticular zone or foraminal zone narrowing. No posttraumatic sequelae such as fracture or traumatic subluxation, are evident. Electronically Signed   By: Staci Righter M.D.   On: 11/02/2017 13:51   Dg Chest Port 1 View  Result Date: 2017-12-03 CLINICAL DATA:  Respiratory failure. EXAM: PORTABLE CHEST 1 VIEW COMPARISON:  Radiograph of November 18, 2017. FINDINGS: Stable cardiomediastinal  silhouette. Stable position of feeding tube. No pneumothorax is noted. Mild to moderate bibasilar subsegmental atelectasis or infiltrates are noted. No pleural effusion is noted. Bony thorax is unremarkable. IMPRESSION: Mild to moderate bibasilar subsegmental atelectasis or infiltrates are noted. Electronically Signed   By: Marijo Conception, M.D.   On: 03-Dec-2017 07:24   Dg Chest Port 1 View  Result Date: 11/18/2017 CLINICAL DATA:  Abnormal respiration EXAM: PORTABLE CHEST 1 VIEW COMPARISON:  11/16/2017 FINDINGS: Normal cardiac silhouette. Feeding tube in gastric antrum. New RIGHT lower lobe airspace disease. Upper lungs clear. IMPRESSION: RIGHT lower lobe pneumonia versus aspiration pneumonitis. These results will be  called to the ordering clinician or representative by the Radiologist Assistant, and communication documented in the PACS or zVision Dashboard. Electronically Signed   By: Suzy Bouchard M.D.   On: 11/18/2017 10:16   Dg Chest Port 1 View  Result Date: 11/16/2017 CLINICAL DATA:  Right rib fracture. EXAM: PORTABLE CHEST 1 VIEW COMPARISON:  CT 10/21/2017.  Chest x-ray 11/03/2017 FINDINGS: Mediastinum hilar structures are normal. Left base infiltrate. No pleural effusion or pneumothorax. Cervicothoracic spine fusion. Hardware intact. Right lower rib fractures difficult to identify interim best identified by prior CT. Right posterior ninth rib fracture again noted without interim change. IMPRESSION: 1.  New left base infiltrate consistent with pneumonia. 2. Rib fractures are difficult to identify and are best identified on prior studies. Right posterior ninth rib fracture again noted and is unchanged in appearance. No pneumothorax. Electronically Signed   By: Marcello Moores  Register   On: 11/16/2017 09:01   Dg Chest Port 1 View  Result Date: 11/04/2017 CLINICAL DATA:  Pain following fall EXAM: PORTABLE CHEST 1 VIEW COMPARISON:  None. FINDINGS: Lungs are clear. Heart size is upper normal with  pulmonary vascularity normal. No adenopathy. No pneumothorax. There are nondisplaced fractures of the posterolateral right ninth, tenth, and eleventh ribs. There is a suspected incomplete fracture of the lateral right seventh rib. Postoperative change noted in the lower cervical spine region. IMPRESSION: Several nondisplaced rib fractures on the right. No pneumothorax or pleural effusion evident. No edema or consolidation. Heart upper normal in size. Electronically Signed   By: Lowella Grip III M.D.   On: 10/25/2017 11:50    Microbiology Recent Results (from the past 240 hour(s))  MRSA PCR Screening     Status: None   Collection Time: 11/13/2017  2:12 PM  Result Value Ref Range Status   MRSA by PCR NEGATIVE NEGATIVE Final    Comment:        The GeneXpert MRSA Assay (FDA approved for NASAL specimens only), is one component of a comprehensive MRSA colonization surveillance program. It is not intended to diagnose MRSA infection nor to guide or monitor treatment for MRSA infections. Performed at [facility], [place] [address] [place],  [address]     Lab Basic Metabolic Panel: Recent Labs  Lab 11/16/17 1623 11/16/17 1632 11/17/17 0438 11/17/17 1706 11/18/17 0620 11/19/17 0331 16-Dec-2017 0518  NA  --  139 137  --  145 151* 155*  K  --  5.1 5.1  --  4.7 5.1 4.9  CL  --  106 109  --  117* 119* 127*  CO2  --  25 25  --  24 23 20*  GLUCOSE  --  249* 356*  --  160* 260* 165*  BUN  --  44* 51*  --  55* 70* 103*  CREATININE  --  2.40* 2.10*  --  2.19* 2.37* 2.84*  CALCIUM  --  8.9 8.8*  --  8.7* 8.1* 7.8*  MG 1.9  --  2.0 2.0 2.3  --   --   PHOS 2.9  --  1.6* <1.0* 1.5*  --   --    Liver Function Tests: No results for input(s): AST, ALT, ALKPHOS, BILITOT, PROT, ALBUMIN in the last 168 hours. No results for input(s): LIPASE, AMYLASE in the last 168 hours. No results for input(s): AMMONIA in the last 168 hours. CBC: Recent Labs  Lab 11/17/17 0438 11/18/17 0620  11/19/17 0331 16-Dec-2017 0518  WBC 11.2* 10.1 9.8 16.1*  NEUTROABS 9.6*  8.1*  --   --   HGB 11.0* 11.0* 10.3* 10.1*  HCT 34.8* 33.8* 32.3* 33.3*  MCV 94.6 93.6 96.1 99.7  PLT 138* 125* 148* 136*   Cardiac Enzymes: No results for input(s): CKTOTAL, CKMB, CKMBINDEX, TROPONINI in the last 168 hours. Sepsis Labs: Recent Labs  Lab 11/17/17 0438 11/18/17 0620 11/19/17 0331 12-03-17 0518  WBC 11.2* 10.1 9.8 16.1*    Procedures/Operations  none   Zenovia Jarred 11/23/2017, 1:34 PM

## 2017-12-16 NOTE — Progress Notes (Signed)
   12/04/17 1000  Clinical Encounter Type  Visited With Patient and family together  Visit Type Initial  Referral From Nurse  Consult/Referral To Nurse  Spiritual Encounters  Spiritual Needs Prayer;Emotional  Stress Factors  Patient Stress Factors None identified  Family Stress Factors Exhausted;Major life changes   Responded to spiritual care consult. PT was unresponsive and some family was at bedside. Family requested that I come back in an hour so that the rest of the family could be here. I will follow up shortly.   Chaplain Fidel Levy  [phone]

## 2017-12-16 NOTE — Consult Note (Signed)
Consultation Note Date: Dec 19, 2017   Patient Name: Todd Hall  DOB: 01-Feb-1945  MRN: [id number]  Age / Sex: 72 y.o., male  PCP: No primary care provider on file. Referring Physician: Md, Trauma, MD  Reason for Consultation: Establishing goals of care, Psychosocial/spiritual support and Withdrawal of life-sustaining treatment  HPI/Patient Profile: 72 y.o. male  with past medical history of CAD, CKD, previous stroke, frequent falls, and a recent cervical fracture who was admitted on 11/09/2017 after a fall.  He was found to have an acute intracranial hemorrhage and  multiple rib fractures.  His mental status was significantly altered.  He developed pneumonia likely due to aspiration.  Per his wife he has not woken up in several days.  Clinical Assessment and Goals of Care:  I have reviewed medical records including EPIC notes, labs and imaging, received report from Dr. Grandville Silos, assessed the patient and then met at the bedside along with the patient's wife  to discuss diagnosis prognosis, GOC, EOL wishes, disposition and options.  I introduced Palliative Medicine as specialized medical care for people living with serious illness. It focuses on providing relief from the symptoms and stress of a serious illness. The goal is to improve quality of life for both the patient and the family.  We discussed a brief life review of the patient. He was an Clinical biochemist, and loved NASCAR.  Per his wife he loved anything to do with cars.  He has been battling health issues for some time and fractures his neck just 1 month ago.  Unfortunately his son died suddenly 3 months ago from sepsis and diabetic coma.  When I met this family Dr. Grandville Silos had already had multiple [place] conversations.  The patient was DNR/DNI and the family had a good understanding of his current health status.  I attempted to elicit values and goals of care  important to the patient.  His wife was clear that he would not want to live this way and she does not want to prolong any potential suffering.  However she was wrestling with the issue of this being "Her Decision".  Mrs. Chuba is of the [place] and she commented "I wish God would just make this decision for me".  As we talked I reassured her that God made the decision on the day [name] fell, and that these few days were just God giving the family time to try to process what was happening.  Hospice and Palliative Care services outpatient were explained and offered. Mrs. Maharaj expressed a desire to have her husband close to home ([place]). However, as the patient is so fragile the family and medical team agree that it is best for him not to be transported.  Questions and concerns were addressed. Gone from My Sight booklet left for review. The family was encouraged to call with questions or concerns.    Primary Decision Maker:  NEXT OF KIN Wife, Mrs. Vorhees    SUMMARY OF RECOMMENDATIONS     Will shift to full comfort.  Discussed with  Dr. Grandville Silos and bedside RN Elle.  Will leave some oxygen in place until family has had a chance to gather.   Will transfer to 6 N   Discontinue interventions not geared towards comfort.     Continue comfort orientated interventions as well as Lantus insulin QHS for now as patient is very insulin resistant.   Dilaudid for comfort or respiratory distress.  Given renal failure we will avoid morphine.   Robinul for excess secretions and rattle.  Code Status/Advance Care Planning:  DNR  Additional Recommendations (Limitations, Scope, Preferences):  Full Comfort Care  Palliative Prophylaxis:   Frequent Pain Assessment  Psycho-social/Spiritual:   Desire for further Chaplaincy support: requested  Prognosis:  Hours to days.      Discharge Planning: Anticipated Hospital Death      Primary Diagnoses: Present on Admission: . TBI  (traumatic brain injury) ([place])   I have reviewed the medical record, interviewed the patient and family, and examined the patient. The following aspects are pertinent.  Past Medical History:  Diagnosis Date  . Stroke [facility])    Social History   Socioeconomic History  . Marital status: Not on file    Spouse name: Not on file  . Number of children: Not on file  . Years of education: Not on file  . Highest education level: Not on file  Occupational History  . Not on file  Social Needs  . Financial resource strain: Not on file  . Food insecurity:    Worry: Not on file    Inability: Not on file  . Transportation needs:    Medical: Not on file    Non-medical: Not on file  Tobacco Use  . Smoking status: Former Research scientist (life sciences)  . Smokeless tobacco: Never Used  Substance and Sexual Activity  . Alcohol use: Not Currently  . Drug use: Not Currently  . Sexual activity: Not on file  Lifestyle  . Physical activity:    Days per week: Not on file    Minutes per session: Not on file  . Stress: Not on file  Relationships  . Social connections:    Talks on phone: Not on file    Gets together: Not on file    Attends religious service: Not on file    Active member of club or organization: Not on file    Attends meetings of clubs or organizations: Not on file    Relationship status: Not on file  Other Topics Concern  . Not on file  Social History Narrative  . Not on file   No family history on file. Scheduled Meds: . chlorhexidine  15 mL Mouth Rinse BID  . glycopyrrolate  0.4 mg Intravenous TID  . insulin glargine  33 Units Subcutaneous QHS  . mouth rinse  15 mL Mouth Rinse q12n4p  . sodium chloride flush  3 mL Intravenous Q12H  . sodium chloride flush  3 mL Intravenous Q12H   Continuous Infusions: . sodium chloride    . sodium chloride     PRN Meds:.sodium chloride, sodium chloride, acetaminophen (TYLENOL) oral liquid 160 mg/5 mL, acetaminophen, antiseptic oral rinse, glycopyrrolate  **OR** glycopyrrolate **OR** glycopyrrolate, haloperidol **OR** haloperidol **OR** haloperidol lactate, HYDROmorphone (DILAUDID) injection, LORazepam **OR** LORazepam **OR** LORazepam, ondansetron **OR** ondansetron (ZOFRAN) IV, polyvinyl alcohol, sodium chloride flush, sodium chloride flush No Known Allergies Review of Systems patient is comatose  Physical Exam  Well developed elderly gentleman in hard cervical collar, comatose. Resp rattling shallow breaths CV rrr Abdomen thin Lower extremities 1+  edema bilaterally.  Vital Signs: BP (!) 99/59   Pulse 86   Temp 99.1 F (37.3 C) (Rectal)   Resp 20   Ht 5' 6"  (1.676 m)   Wt 74.9 kg   SpO2 100%   BMI 26.65 kg/m  Pain Scale: CPOT       SpO2: SpO2: 100 % O2 Device:SpO2: 100 % O2 Flow Rate: .O2 Flow Rate (L/min): 4 L/min  IO: Intake/output summary:   Intake/Output Summary (Last 24 hours) at 11-21-17 0846 Last data filed at 2017-11-21 0700 Gross per 24 hour  Intake 3060.51 ml  Output 1500 ml  Net 1560.51 ml    LBM: Last BM Date: (PTA) Baseline Weight: Weight: 69.9 kg Most recent weight: Weight: 74.9 kg     Palliative Assessment/Data: 10%     Time In: 8:00 Time Out: 9:00 Time Total: 60 min. Greater than 50%  of this time was spent counseling and coordinating care related to the above assessment and plan.  Signed by: Florentina Jenny, PA-C Palliative Medicine Pager: [phone]  Please contact Palliative Medicine Team phone at [phone] for questions and concerns.  For individual provider: See Shea Evans

## 2017-12-16 NOTE — Social Work (Signed)
CSW acknowledging consult for comfort care. Will follow for disposition should hospice services or residential hospice become appropriate.   Dalessandro Baldyga H Marcellina Jonsson, LCSWA  Clinical Social Work [phone]   

## 2017-12-16 NOTE — Progress Notes (Signed)
Patient expired at [place] with family members at bedside and confirmed by day shift RN Jamelle Rushing, on-call Trauma MD Dr. Barry Dienes and Royetta Car - on-call Medical Examiner were made aware, family members still at bedside grieving.  Will await for ME's call if patient is a medical examiner case.

## 2017-12-16 NOTE — Care Management Note (Signed)
Case Management Note  Patient Details  Name: Jakell Trusty MRN: [id number] Date of Birth: 08-13-1945  Subjective/Objective:   Pt admitted on 11/07/2017 s/p fall down multiple stairs while on Plavix; he sustained TBI with SAH and SDH, remote C7 TVP fx, and multiple rib fx.  PTA, pt resided at home with spouse.                   Action/Plan: Unfortunately, pt developed PNA, and has not woken up in several days.  Family has chosen to offer full comfort measures.  Trauma Case Manager will continue to follow/ offer support to family and staff as appropriate.  Expected Discharge Date:                  Expected Discharge Plan:     In-House Referral:  Clinical Social Work  Discharge planning Services  CM Consult  Post Acute Care Choice:    Choice offered to:     DME Arranged:    DME Agency:     HH Arranged:    HH Agency:     Status of Service:  In process, will continue to follow  If discussed at Long Length of Stay Meetings, dates discussed:    Additional Comments:  Ella Bodo, RN 24-Nov-2017, 3:10 PM

## 2017-12-16 NOTE — Progress Notes (Signed)
Cortrak, C-collar, and cooling blanket all removed from patient. Wife updated at bedside. Will continue to monitor. Lianne Bushy RN BSN.

## 2017-12-16 DEATH — deceased

## 2018-12-01 IMAGING — CT CT CERVICAL SPINE W/O CM
4 of 8 series · 13 of 33 positions shown, 14 images · non-contrast
Comparison: 11/01/2017.

CLINICAL DATA: Mechanical fall down 13 stairs. Loss of
consciousness.

EXAM:
CT HEAD WITHOUT CONTRAST
CT CERVICAL SPINE WITHOUT CONTRAST
TECHNIQUE: Multidetector CT imaging of the head and cervical spine was
performed following the standard protocol without intravenous
contrast. Multiplanar CT image reconstructions of the cervical spine
were also generated.

[Series 8: c_spine 2.0 st · axial · 0.25mm/px · z∈[+1246,+1354]mm · 3 of 108 slices shown, 4 images]
[im 27/108  soft-tissue]
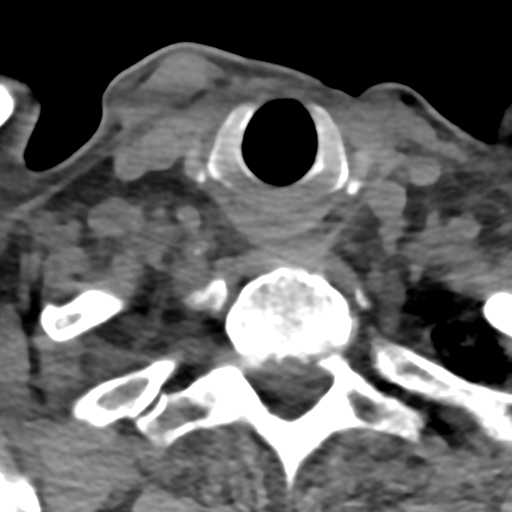
[im 27/108  bone]
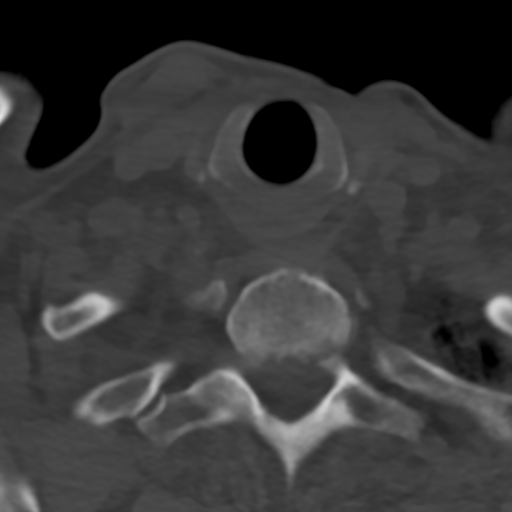
[im 54/108  bone]
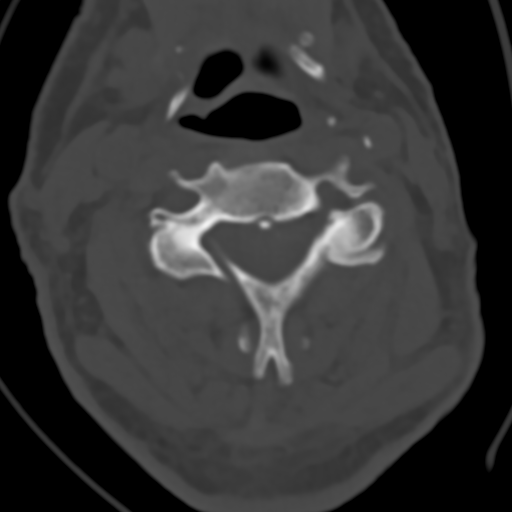
[im 81/108  bone]
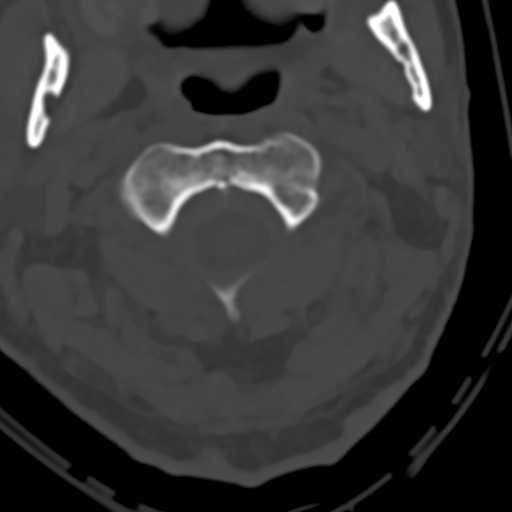

[Series 10: c_spine 2.0 sag bone · sagittal · 0.26mm/px · 4 of 61 slices shown]
[im 13/61  bone]
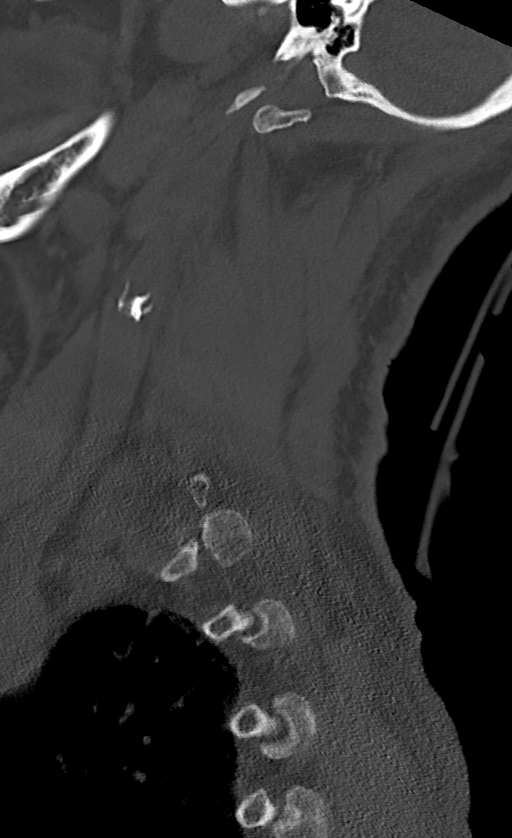
[im 25/61  bone]
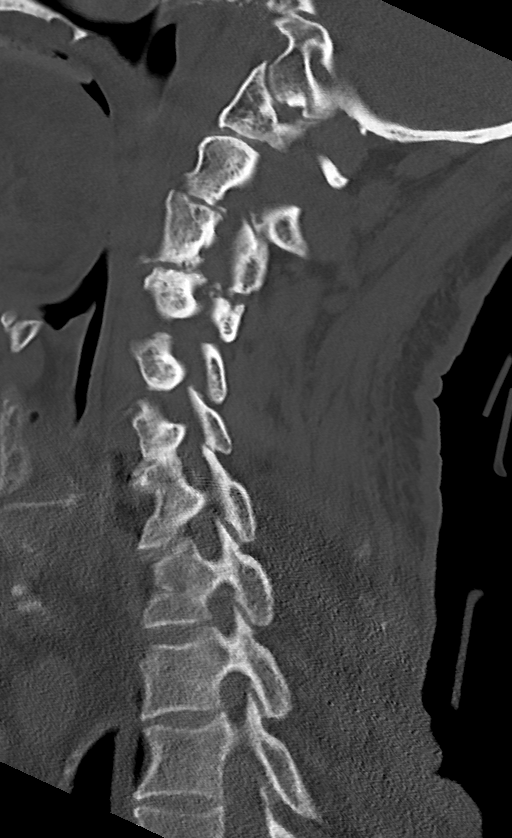
[im 37/61  bone]
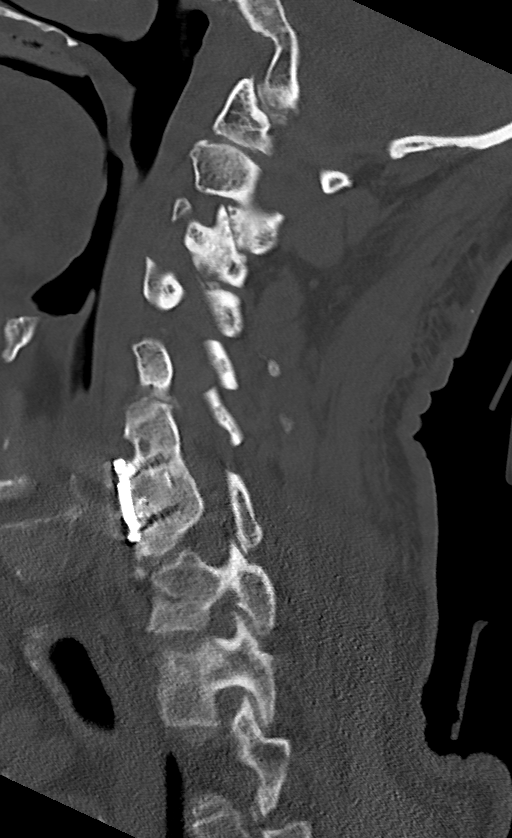
[im 49/61  bone]
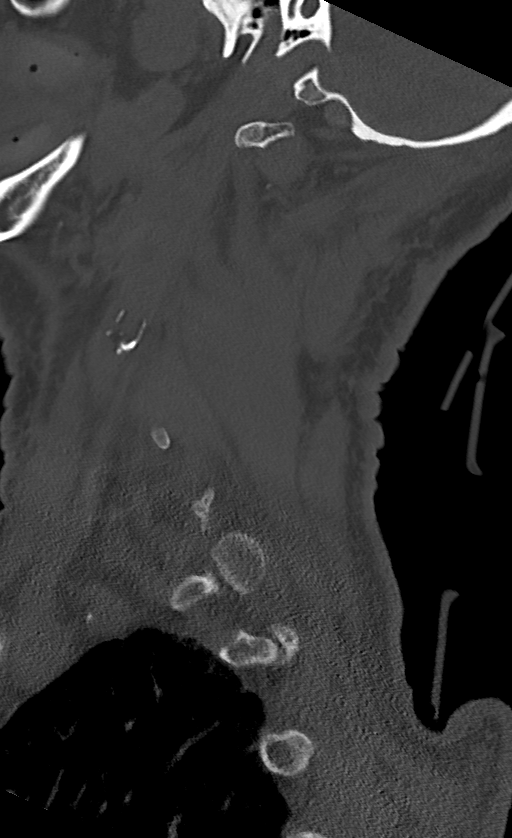

[Series 11: c_spine 2.0 cor bone · coronal · 0.32mm/px · 3 of 61 slices shown]
[im 16/61  bone]
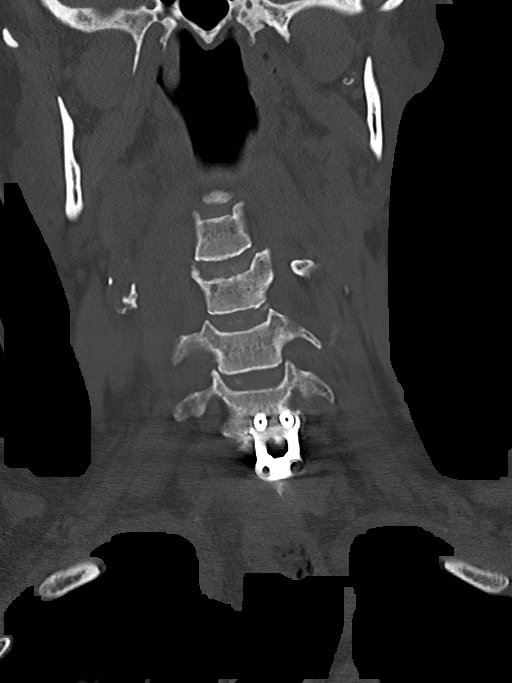
[im 31/61  bone]
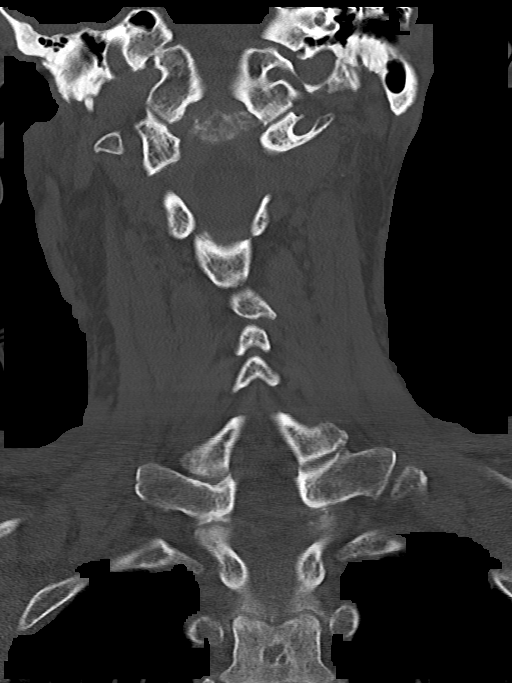
[im 46/61  bone]
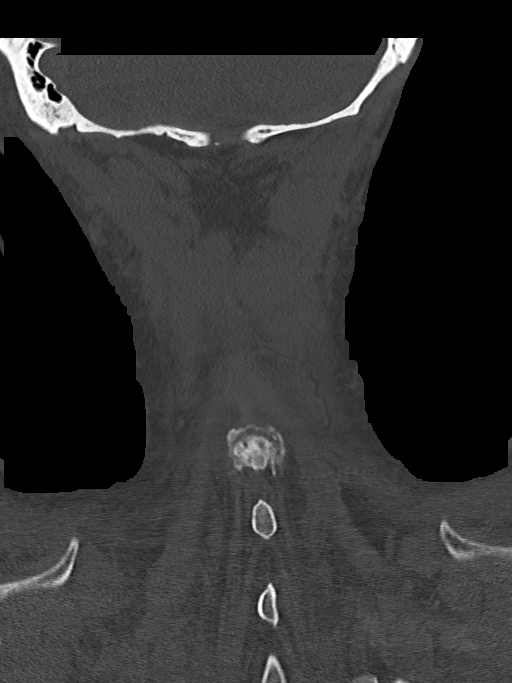

[Series 12: c_spine 2.0 orthogonals · axial · 0.21mm/px · z∈[+1224,+1334]mm · 3 of 111 slices shown]
[im 28/111  bone]
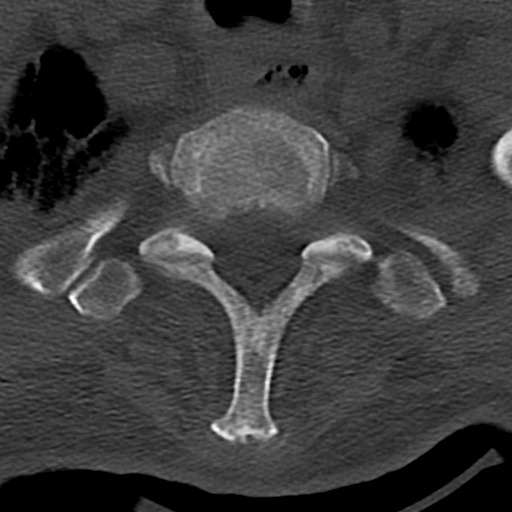
[im 56/111  bone]
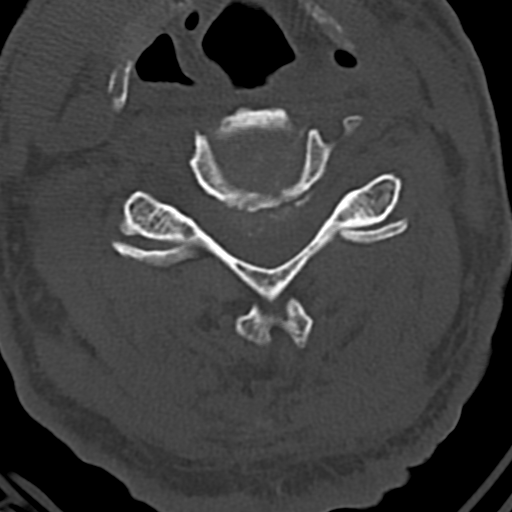
[im 83/111  bone]
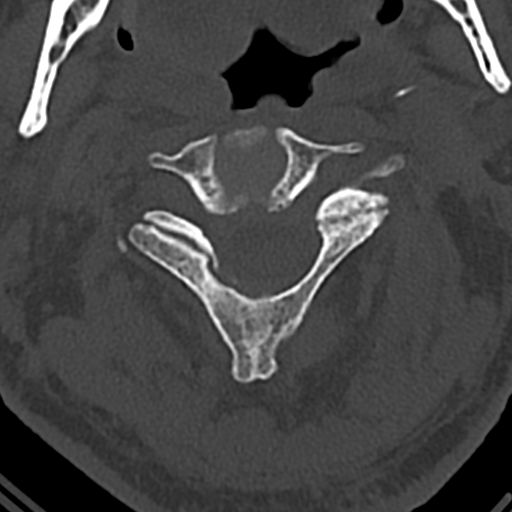

[13 of 33 positions shown; findings below may reference images not displayed]

FINDINGS: CT HEAD FINDINGS

Brain: There are multiple areas of intracranial hemorrhage.

There is a parenchymal hemorrhage centered on the left ventricular
nuclei, measuring 26 x 18 x 18 mm. There is adjacent less
well-defined parenchymal hemorrhage anterior to the base a ganglia
and along the anterior left insular cortex. There is a parenchymal
hemorrhage in the subcortical white matter of the superior,
posterior right frontal lobe, measuring 19 x 12 x 17 mm, with a tiny
adjacent focus of subcortical white matter hemorrhage. Parenchymal
hemorrhage is seen in the inferior right frontal lobe anterior to
the sylvian fissure. There are also small areas of subarachnoid
hemorrhage, along the right sylvian fissure, over the anterior left
frontal lobe, in the anterior medial frontal lobes adjacent to the
interhemispheric fissure and in the superior right parietal lobe.
Subdural hemorrhage is noted along the anterior interhemispheric
fissure. There is intraventricular hemorrhage, between the septum
flu syndrome and in the right lateral ventricle. There is no
significant mass effect and no midline shift.

No evidence of an ischemic infarct.

The ventricles are normal in overall configuration. There is no
hydrocephalus.

No extra-axial masses.

Vascular: No hyperdense vessel or unexpected calcification.

Skull: No skull fracture or lesion.

Sinuses/Orbits: Globes and orbits are unremarkable. Mild ethmoid and
left maxillary sinus mucosal thickening. Small amount of dependent
fluid in the left maxillary sinus.

Other: None.

CT CERVICAL SPINE FINDINGS

Alignment: Slight degenerative anterolisthesis of C3-C4, stable. No
other spondylolisthesis.

Skull base and vertebrae: No acute fracture. Nondisplaced fracture
of left C7 transverse process is stable from the prior study. No
primary bone lesion or focal pathologic process.

Soft tissues and spinal canal: No prevertebral fluid or swelling. No
visible canal hematoma.

Disc levels: Status post anterior cervical disc fusion at C6-C7.
Anterior fusion plate and fixation screws well-seated. There is
mature bone fusing the disc interspace. The appearance is stable.

There is spondylotic disc bulging and endplate spurring from C3-C4
through C5-C6. No evidence of an acute disc herniation. Facet
degenerative changes are noted most severe on the right at C3-C4.
These findings are stable.

Upper chest: No acute findings. No mass or adenopathy. Clear lung
apices.

Other: None.
IMPRESSION: HEAD CT

1. There is acute intracranial hemorrhage with areas of parenchymal
hemorrhage, subarachnoid and subdural hemorrhage and
intraventricular hemorrhage as detailed above. No hydrocephalus. No
midline shift.
2. No skull fracture.

Critical Value/emergent results were called by telephone at the time
of interpretation on 11/15/2017 at [DATE] to Dr. NELOCUNHA SIEBENALER ,
who verbally acknowledged these results.

CERVICAL CT

1. No acute fracture or acute finding. C7 left transverse process
fracture noted on the prior study is stable.

## 2018-12-04 IMAGING — DX DG CHEST 1V PORT
1 series · 1 of 1 positions shown · non-contrast
Comparison: 11/16/2017

CLINICAL DATA: Abnormal respiration

EXAM:
PORTABLE CHEST 1 VIEW

[chest]
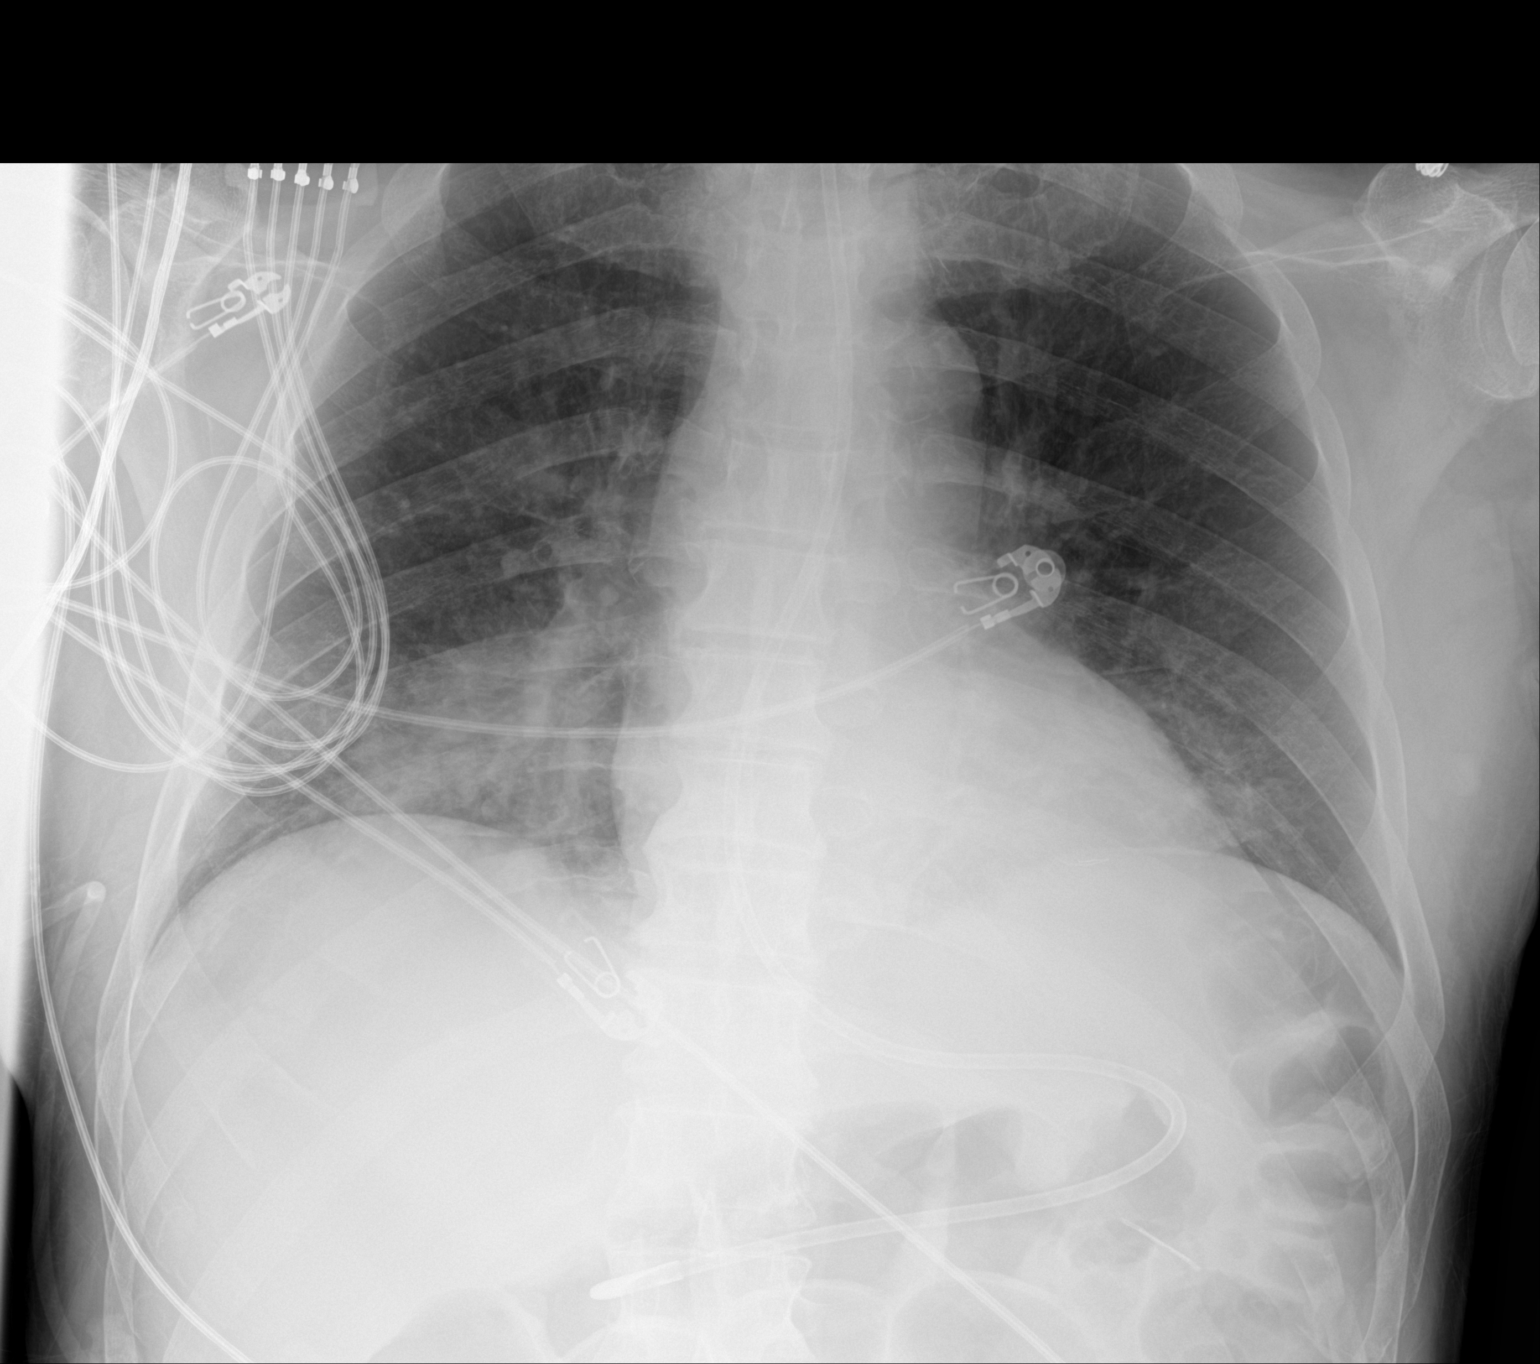

[1 of 1 positions shown; findings below may reference images not displayed]

FINDINGS: Normal cardiac silhouette. Feeding tube in gastric antrum. New RIGHT
lower lobe airspace disease. Upper lungs clear.
IMPRESSION: RIGHT lower lobe pneumonia versus aspiration pneumonitis.

These results will be called to the ordering clinician or
representative by the Radiologist Assistant, and communication
documented in the PACS or zVision Dashboard.
